# Patient Record
Sex: Female | Born: 1995 | Race: White | Hispanic: No | Marital: Single | Smoking: Former smoker
Health system: Southern US, Community
[De-identification: ages and names within clinical notes are randomized; demographics above are authoritative.]

## PROBLEM LIST (undated history)

## (undated) ENCOUNTER — Inpatient Hospital Stay (HOSPITAL_COMMUNITY): Payer: Self-pay

## (undated) DIAGNOSIS — F32A Depression, unspecified: Secondary | ICD-10-CM

## (undated) DIAGNOSIS — J45909 Unspecified asthma, uncomplicated: Secondary | ICD-10-CM

## (undated) DIAGNOSIS — G709 Myoneural disorder, unspecified: Secondary | ICD-10-CM

## (undated) DIAGNOSIS — K219 Gastro-esophageal reflux disease without esophagitis: Secondary | ICD-10-CM

## (undated) DIAGNOSIS — I499 Cardiac arrhythmia, unspecified: Secondary | ICD-10-CM

## (undated) DIAGNOSIS — N390 Urinary tract infection, site not specified: Secondary | ICD-10-CM

## (undated) DIAGNOSIS — Z789 Other specified health status: Secondary | ICD-10-CM

## (undated) DIAGNOSIS — Z5189 Encounter for other specified aftercare: Secondary | ICD-10-CM

## (undated) DIAGNOSIS — O24419 Gestational diabetes mellitus in pregnancy, unspecified control: Secondary | ICD-10-CM

## (undated) DIAGNOSIS — D649 Anemia, unspecified: Secondary | ICD-10-CM

## (undated) DIAGNOSIS — F329 Major depressive disorder, single episode, unspecified: Secondary | ICD-10-CM

## (undated) DIAGNOSIS — R519 Headache, unspecified: Secondary | ICD-10-CM

## (undated) DIAGNOSIS — Z8489 Family history of other specified conditions: Secondary | ICD-10-CM

## (undated) HISTORY — DX: Major depressive disorder, single episode, unspecified: F32.9

## (undated) HISTORY — DX: Urinary tract infection, site not specified: N39.0

## (undated) HISTORY — DX: Depression, unspecified: F32.A

## (undated) HISTORY — PX: NO PAST SURGERIES: SHX2092

---
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
## 2018-02-15 ENCOUNTER — Inpatient Hospital Stay (HOSPITAL_COMMUNITY): Payer: Medicaid Other

## 2018-02-15 ENCOUNTER — Encounter (HOSPITAL_COMMUNITY): Payer: Self-pay | Admitting: *Deleted

## 2018-02-15 ENCOUNTER — Inpatient Hospital Stay (HOSPITAL_COMMUNITY)
Admission: AD | Admit: 2018-02-15 | Discharge: 2018-02-15 | Disposition: A | Discharge status: Home / Self Care | Payer: Medicaid Other | Source: Ambulatory Visit | Attending: Obstetrics & Gynecology | Admitting: Obstetrics & Gynecology

## 2018-02-15 DIAGNOSIS — R109 Unspecified abdominal pain: Secondary | ICD-10-CM

## 2018-02-15 DIAGNOSIS — Z3A01 Less than 8 weeks gestation of pregnancy: Secondary | ICD-10-CM | POA: Insufficient documentation

## 2018-02-15 DIAGNOSIS — O26891 Other specified pregnancy related conditions, first trimester: Secondary | ICD-10-CM

## 2018-02-15 DIAGNOSIS — Z3491 Encounter for supervision of normal pregnancy, unspecified, first trimester: Secondary | ICD-10-CM

## 2018-02-15 LAB — CBC
HCT: 39.8 % (ref 36.0–46.0)
HEMOGLOBIN: 13.6 g/dL (ref 12.0–15.0)
MCH: 29.6 pg (ref 26.0–34.0)
MCHC: 34.2 g/dL (ref 30.0–36.0)
MCV: 86.7 fL (ref 78.0–100.0)
Platelets: 279 10*3/uL (ref 150–400)
RBC: 4.59 MIL/uL (ref 3.87–5.11)
RDW: 13.4 % (ref 11.5–15.5)
WBC: 9.5 10*3/uL (ref 4.0–10.5)

## 2018-02-15 LAB — POCT PREGNANCY, URINE: PREG TEST UR: POSITIVE — AB

## 2018-02-15 LAB — URINALYSIS, ROUTINE W REFLEX MICROSCOPIC
Bilirubin Urine: NEGATIVE
Glucose, UA: NEGATIVE mg/dL
Hgb urine dipstick: NEGATIVE
Ketones, ur: NEGATIVE mg/dL
Leukocytes, UA: NEGATIVE
NITRITE: NEGATIVE
Protein, ur: NEGATIVE mg/dL
Specific Gravity, Urine: 1.019 (ref 1.005–1.030)
pH: 6 (ref 5.0–8.0)

## 2018-02-15 LAB — ABO/RH: ABO/RH(D): A POS

## 2018-02-15 LAB — HCG, QUANTITATIVE, PREGNANCY: HCG, BETA CHAIN, QUANT, S: 8900 m[IU]/mL — AB (ref <5)

## 2018-02-15 LAB — WET PREP, GENITAL
CLUE CELLS WET PREP: NONE SEEN
Sperm: NONE SEEN
Trich, Wet Prep: NONE SEEN
YEAST WET PREP: NONE SEEN

## 2018-02-15 NOTE — Discharge Instructions (Signed)
[facility] Prenatal Care Providers   Center for [facility] at [facility]       Phone: [phone]  Center for [facility] [place] Phone: [phone]  Center for [employer] at [place]  Phone: [phone]  Center for [facility] at [facility]  Phone: [phone]  Center for [facility] at [facility]  Phone: [phone]  [facility]       Phone: [phone]  [facility]    Phone: [phone]   [facility])    Phone: [phone]  [facility]    Phone: [phone]  [facility]    Phone: [phone]    [facility]  Phone: [phone]  [facility]    Phone: [phone]  [facility]   Phone: [phone]  [facility]    Phone: [phone]      First Trimester of Pregnancy The first trimester of pregnancy is from week 1 until the end of week 13 (months 1 through 3). A week after a sperm fertilizes an egg, the egg will implant on the wall of the uterus. This embryo will begin to develop into a baby. Genes from you and your partner will form the baby. The female genes will determine whether the baby will be a boy or a girl. At 6-8 weeks, the eyes and face will be formed, and the heartbeat can be seen on ultrasound. At the end of 12 weeks, all the baby's organs will be formed. Now that you are pregnant, you will want to do everything you can to have a healthy baby. Two of the most important things are to get good prenatal care and to follow your health care provider's instructions. Prenatal care is all the medical care you receive before the baby's birth. This care will help prevent, find, and treat any problems during the pregnancy and childbirth. Body changes during your first trimester Your body goes through many changes  during pregnancy. The changes vary from woman to woman.  You may gain or lose a couple of pounds at first.  You may feel sick to your stomach (nauseous) and you may throw up (vomit). If the vomiting is uncontrollable, call your health care provider.  You may tire easily.  You may develop headaches that can be relieved by medicines. All medicines should be approved by your health care provider.  You may urinate more often. Painful urination may mean you have a bladder infection.  You may develop heartburn as a result of your pregnancy.  You may develop constipation because certain hormones are causing the muscles that push stool through your intestines to slow down.  You may develop hemorrhoids or swollen veins (varicose veins).  Your breasts may begin to grow larger and become tender. Your nipples may stick out more, and the tissue that surrounds them (areola) may become darker.  Your gums may bleed and may be sensitive to brushing and flossing.  Dark spots or blotches (chloasma, mask of pregnancy) may develop on your face. This will likely fade after the baby is born.  Your menstrual periods will stop.  You may have a loss of appetite.  You may develop cravings for certain kinds of food.  You may have changes in your emotions from day to day, such as being excited to be pregnant or being concerned that something may go wrong with the pregnancy and baby.  You may have more vivid  and strange dreams.  You may have changes in your hair. These can include thickening of your hair, rapid growth, and changes in texture. Some women also have hair loss during or after pregnancy, or hair that feels dry or thin. Your hair will most likely return to normal after your baby is born.  What to expect at prenatal visits During a routine prenatal visit:  You will be weighed to make sure you and the baby are growing normally.  Your blood pressure will be taken.  Your abdomen will be measured  to track your baby's growth.  The fetal heartbeat will be listened to between weeks 10 and 14 of your pregnancy.  Test results from any previous visits will be discussed.  Your health care provider may ask you:  How you are feeling.  If you are feeling the baby move.  If you have had any abnormal symptoms, such as leaking fluid, bleeding, severe headaches, or abdominal cramping.  If you are using any tobacco products, including cigarettes, chewing tobacco, and electronic cigarettes.  If you have any questions.  Other tests that may be performed during your first trimester include:  Blood tests to find your blood type and to check for the presence of any previous infections. The tests will also be used to check for low iron levels (anemia) and protein on red blood cells (Rh antibodies). Depending on your risk factors, or if you previously had diabetes during pregnancy, you may have tests to check for high blood sugar that affects pregnant women (gestational diabetes).  Urine tests to check for infections, diabetes, or protein in the urine.  An ultrasound to confirm the proper growth and development of the baby.  Fetal screens for spinal cord problems (spina bifida) and Down syndrome.  HIV (human immunodeficiency virus) testing. Routine prenatal testing includes screening for HIV, unless you choose not to have this test.  You may need other tests to make sure you and the baby are doing well.  Follow these instructions at home: Medicines  Follow your health care provider's instructions regarding medicine use. Specific medicines may be either safe or unsafe to take during pregnancy.  Take a prenatal vitamin that contains at least 600 micrograms (mcg) of folic acid.  If you develop constipation, try taking a stool softener if your health care provider approves. Eating and drinking  Eat a balanced diet that includes fresh fruits and vegetables, whole grains, good sources of  protein such as meat, eggs, or tofu, and low-fat dairy. Your health care provider will help you determine the amount of weight gain that is right for you.  Avoid raw meat and uncooked cheese. These carry germs that can cause birth defects in the baby.  Eating four or five small meals rather than three large meals a day may help relieve nausea and vomiting. If you start to feel nauseous, eating a few soda crackers can be helpful. Drinking liquids between meals, instead of during meals, also seems to help ease nausea and vomiting.  Limit foods that are high in fat and processed sugars, such as fried and sweet foods.  To prevent constipation: ? Eat foods that are high in fiber, such as fresh fruits and vegetables, whole grains, and beans. ? Drink enough fluid to keep your urine clear or pale yellow. Activity  Exercise only as directed by your health care provider. Most women can continue their usual exercise routine during pregnancy. Try to exercise for 30 minutes at least 5 days a  week. Exercising will help you: ? Control your weight. ? Stay in shape. ? Be prepared for labor and delivery.  Experiencing pain or cramping in the lower abdomen or lower back is a good sign that you should stop exercising. Check with your health care provider before continuing with normal exercises.  Try to avoid standing for long periods of time. Move your legs often if you must stand in one place for a long time.  Avoid heavy lifting.  Wear low-heeled shoes and practice good posture.  You may continue to have sex unless your health care provider tells you not to. Relieving pain and discomfort  Wear a good support bra to relieve breast tenderness.  Take warm sitz baths to soothe any pain or discomfort caused by hemorrhoids. Use hemorrhoid cream if your health care provider approves.  Rest with your legs elevated if you have leg cramps or low back pain.  If you develop varicose veins in your legs, wear  support hose. Elevate your feet for 15 minutes, 3-4 times a day. Limit salt in your diet. Prenatal care  Schedule your prenatal visits by the twelfth week of pregnancy. They are usually scheduled monthly at first, then more often in the last 2 months before delivery.  Write down your questions. Take them to your prenatal visits.  Keep all your prenatal visits as told by your health care provider. This is important. Safety  Wear your seat belt at all times when driving.  Make a list of emergency phone numbers, including numbers for family, friends, the hospital, and police and fire departments. General instructions  Ask your health care provider for a referral to a local prenatal education class. Begin classes no later than the beginning of month 6 of your pregnancy.  Ask for help if you have counseling or nutritional needs during pregnancy. Your health care provider can offer advice or refer you to specialists for help with various needs.  Do not use hot tubs, steam rooms, or saunas.  Do not douche or use tampons or scented sanitary pads.  Do not cross your legs for long periods of time.  Avoid cat litter boxes and soil used by cats. These carry germs that can cause birth defects in the baby and possibly loss of the fetus by miscarriage or stillbirth.  Avoid all smoking, herbs, alcohol, and medicines not prescribed by your health care provider. Chemicals in these products affect the formation and growth of the baby.  Do not use any products that contain nicotine or tobacco, such as cigarettes and e-cigarettes. If you need help quitting, ask your health care provider. You may receive counseling support and other resources to help you quit.  Schedule a dentist appointment. At home, brush your teeth with a soft toothbrush and be gentle when you floss. Contact a health care provider if:  You have dizziness.  You have mild pelvic cramps, pelvic pressure, or nagging pain in the  abdominal area.  You have persistent nausea, vomiting, or diarrhea.  You have a bad smelling vaginal discharge.  You have pain when you urinate.  You notice increased swelling in your face, hands, legs, or ankles.  You are exposed to fifth disease or chickenpox.  You are exposed to [place] measles (rubella) and have never had it. Get help right away if:  You have a fever.  You are leaking fluid from your vagina.  You have spotting or bleeding from your vagina.  You have severe abdominal cramping or pain.  You have rapid weight gain or loss.  You vomit blood or material that looks like coffee grounds.  You develop a severe headache.  You have shortness of breath.  You have any kind of trauma, such as from a fall or a car accident. Summary  The first trimester of pregnancy is from week 1 until the end of week 13 (months 1 through 3).  Your body goes through many changes during pregnancy. The changes vary from woman to woman.  You will have routine prenatal visits. During those visits, your health care provider will examine you, discuss any test results you may have, and talk with you about how you are feeling. This information is not intended to replace advice given to you by your health care provider. Make sure you discuss any questions you have with your health care provider. Document Released: 08/22/2001 Document Revised: 08/09/2016 Document Reviewed: 08/09/2016 Elsevier Interactive Patient Education  [employer].

## 2018-02-15 NOTE — MAU Provider Note (Signed)
History     CSN: [id number]  Arrival date and time: 02/15/18 1309   First Provider Initiated Contact with Patient 02/15/18 1412      Chief Complaint  Patient presents with  . Abdominal Pain  . Possible Pregnancy   HPI  Gloria Berg is a 22 y.o. G1P0 at 7132w6d by LMP who presents with abdominal cramping. Reports lower abdominal cramping x 1 week. Initially felt like her period was going to start. Occasionally pain feels like a "catch" in her side. Pain is worse on right side. Rates pain 5/10. Has not treated. Nothing makes better or worse. Had a positive pregnancy test yesterday.  Occasional nausea & vomits after eating. Also reports intermittent episodes of watery stool for the last 2 weeks. Her mother has similar symptoms. Denies fever/chills, hematochezia,, vaginal bleeding, dysuria. Some vaginal discharge that is thin and white. No vaginal odor or irritation.    OB History    Gravida  1   Para      Term      Preterm      AB      Living        SAB      TAB      Ectopic      Multiple      Live Births              Past Medical History:  Diagnosis Date  . Urinary tract infection     No past surgical history on file.  Family History  Problem Relation Age of Onset  . Drug abuse Mother   . Anxiety disorder Father        takes medication for  . Drug abuse Maternal Aunt   . Drug abuse Maternal Uncle   . Drug abuse Maternal Grandfather        died of overdose    Social History   Tobacco Use  . Smoking status: Never Smoker  . Smokeless tobacco: Never Used  Substance Use Topics  . Alcohol use: No    Comment: 1x June 2013  . Drug use: No    Types: Marijuana    Comment: 6 times between May and June 2013    Allergies: No Known Allergies  Medications Prior to Admission  Medication Sig Dispense Refill Last Dose  . acetaminophen (TYLENOL) 500 MG tablet Take 1,000 mg by mouth every 6 (six) hours as needed for pain.   05/04/2013 at Unknown  .  ibuprofen (ADVIL,MOTRIN) 600 MG tablet Take 1 tablet (600 mg total) by mouth every 6 (six) hours as needed for pain. 30 tablet 0     Review of Systems  Constitutional: Negative.   Gastrointestinal: Positive for abdominal pain, diarrhea, nausea and vomiting. Negative for blood in stool and constipation.  Genitourinary: Positive for vaginal discharge. Negative for dysuria and vaginal bleeding.   Physical Exam   Blood pressure 122/70, pulse 96, temperature 98.2 F (36.8 C), temperature source Oral, resp. rate 16, height 5\' 3"  (1.6 m), weight 152 lb (68.9 kg), last menstrual period 01/05/2018, SpO2 100 %.  Physical Exam  Nursing note and vitals reviewed. Constitutional: She is oriented to person, place, and time. She appears well-developed and well-nourished. No distress.  HENT:  Head: Normocephalic and atraumatic.  Eyes: Conjunctivae are normal. Right eye exhibits no discharge. Left eye exhibits no discharge. No scleral icterus.  Neck: Normal range of motion.  Respiratory: Effort normal. No respiratory distress.  GI: Soft. There is no tenderness.  Genitourinary:  No bleeding in the vagina.  Genitourinary Comments: Cervix closed  Neurological: She is alert and oriented to person, place, and time.  Skin: Skin is warm and dry. She is not diaphoretic.  Psychiatric: She has a normal mood and affect. Her behavior is normal. Judgment and thought content normal.    MAU Course  Procedures Results for orders placed or performed during the hospital encounter of 02/15/18 (from the past 24 hour(s))  Urinalysis, Routine w reflex microscopic     Status: None   Collection Time: 02/15/18  1:42 PM  Result Value Ref Range   Color, Urine YELLOW YELLOW   APPearance CLEAR CLEAR   Specific Gravity, Urine 1.019 1.005 - 1.030   pH 6.0 5.0 - 8.0   Glucose, UA NEGATIVE NEGATIVE mg/dL   Hgb urine dipstick NEGATIVE NEGATIVE   Bilirubin Urine NEGATIVE NEGATIVE   Ketones, ur NEGATIVE NEGATIVE mg/dL    Protein, ur NEGATIVE NEGATIVE mg/dL   Nitrite NEGATIVE NEGATIVE   Leukocytes, UA NEGATIVE NEGATIVE  Pregnancy, urine POC     Status: Abnormal   Collection Time: 02/15/18  2:07 PM  Result Value Ref Range   Preg Test, Ur POSITIVE (A) NEGATIVE  CBC     Status: None   Collection Time: 02/15/18  2:27 PM  Result Value Ref Range   WBC 9.5 4.0 - 10.5 K/uL   RBC 4.59 3.87 - 5.11 MIL/uL   Hemoglobin 13.6 12.0 - 15.0 g/dL   HCT 16.1 09.6 - 04.5 %   MCV 86.7 78.0 - 100.0 fL   MCH 29.6 26.0 - 34.0 pg   MCHC 34.2 30.0 - 36.0 g/dL   RDW 40.9 81.1 - 91.4 %   Platelets 279 150 - 400 K/uL  ABO/Rh     Status: None (Preliminary result)   Collection Time: 02/15/18  2:27 PM  Result Value Ref Range   ABO/RH(D)      A POS Performed at [facility], [address], [place], [place] [address]   hCG, quantitative, pregnancy     Status: Abnormal   Collection Time: 02/15/18  2:27 PM  Result Value Ref Range   hCG, Beta Chain, Quant, S 8,900 (H) <5 mIU/mL  Wet prep, genital     Status: Abnormal   Collection Time: 02/15/18  3:45 PM  Result Value Ref Range   Yeast Wet Prep HPF POC NONE SEEN NONE SEEN   Trich, Wet Prep NONE SEEN NONE SEEN   Clue Cells Wet Prep HPF POC NONE SEEN NONE SEEN   WBC, Wet Prep HPF POC MODERATE (A) NONE SEEN   Sperm NONE SEEN    US Ob Less Than 14 Weeks With Ob Transvaginal  Result Date: 02/15/2018 CLINICAL DATA:  Cramping for 1 week. Gestational age by last menstrual period 5 weeks and 6 days. EXAM: OBSTETRIC <14 WK [place] AND TRANSVAGINAL OB US TECHNIQUE: Both transabdominal and transvaginal ultrasound examinations were performed for complete evaluation of the gestation as well as the maternal uterus, adnexal regions, and pelvic cul-de-sac. Transvaginal technique was performed to assess early pregnancy. COMPARISON:  None. FINDINGS: Intrauterine gestational sac: Present. Yolk sac:  Present. Embryo:  Not present. Cardiac Activity: Not present. MSD: 7.2 mm   5 w   3 d Subchorionic  hemorrhage:  Small subchorionic hemorrhage. Maternal uterus/adnexae: Normal appearance of the adnexa. No free fluid. IMPRESSION: Probable early intrauterine gestational and yolk sac, without fetal pole, or cardiac activity yet visualized. Small subchorionic hemorrhage. Recommend follow-up quantitative B-HCG levels and follow-up US in  14 days to confirm and assess viability. This recommendation follows SRU consensus guidelines: Diagnostic Criteria for Nonviable Pregnancy Early in the First Trimester. Malva Limes Med 2013; 811:9147-82. Electronically Signed   By: Awilda Metro M.D.   On: 02/15/2018 15:17    MDM +UPT UA, wet prep, GC/chlamydia, CBC, ABO/Rh, quant hCG, and [place] today to rule out ectopic pregnancy U/a negative Wet prep negative Ultrasound shows IUGS with yolk sac. Assessment and Plan  A: 1. Normal IUP (intrauterine pregnancy) on prenatal ultrasound, first trimester   2. Abdominal pain during pregnancy in first trimester    P: Discharge home Start prenatal care GC/CT pending Discussed reasons to return to MAU  Judeth Horn 02/15/2018, 2:12 PM

## 2018-02-15 NOTE — MAU Note (Signed)
Pt reports LMP on 01/05/2018, positive home preg test yesterday but she is having a lot of cramping.

## 2018-02-18 LAB — GC/CHLAMYDIA PROBE AMP (~~LOC~~) NOT AT ARMC
CHLAMYDIA, DNA PROBE: NEGATIVE
NEISSERIA GONORRHEA: NEGATIVE

## 2018-03-22 LAB — OB RESULTS CONSOLE HEPATITIS B SURFACE ANTIGEN: Hepatitis B Surface Ag: NEGATIVE

## 2018-03-22 LAB — OB RESULTS CONSOLE ABO/RH: RH TYPE: POSITIVE

## 2018-03-22 LAB — OB RESULTS CONSOLE GC/CHLAMYDIA
Chlamydia: NEGATIVE
Gonorrhea: NEGATIVE

## 2018-03-22 LAB — OB RESULTS CONSOLE RPR: RPR: NONREACTIVE

## 2018-03-22 LAB — OB RESULTS CONSOLE ANTIBODY SCREEN: Antibody Screen: NEGATIVE

## 2018-03-22 LAB — OB RESULTS CONSOLE HIV ANTIBODY (ROUTINE TESTING): HIV: NONREACTIVE

## 2018-03-22 LAB — OB RESULTS CONSOLE RUBELLA ANTIBODY, IGM: RUBELLA: IMMUNE

## 2018-03-24 ENCOUNTER — Inpatient Hospital Stay (HOSPITAL_COMMUNITY)
Admission: AD | Admit: 2018-03-24 | Discharge: 2018-03-24 | Disposition: A | Discharge status: Home / Self Care | Payer: Medicaid Other | Source: Ambulatory Visit | Attending: Obstetrics and Gynecology | Admitting: Obstetrics and Gynecology

## 2018-03-24 ENCOUNTER — Other Ambulatory Visit: Payer: Self-pay

## 2018-03-24 ENCOUNTER — Encounter (HOSPITAL_COMMUNITY): Payer: Self-pay

## 2018-03-24 DIAGNOSIS — O99341 Other mental disorders complicating pregnancy, first trimester: Secondary | ICD-10-CM | POA: Insufficient documentation

## 2018-03-24 DIAGNOSIS — M79604 Pain in right leg: Secondary | ICD-10-CM | POA: Insufficient documentation

## 2018-03-24 DIAGNOSIS — Z3A11 11 weeks gestation of pregnancy: Secondary | ICD-10-CM | POA: Diagnosis not present

## 2018-03-24 DIAGNOSIS — F329 Major depressive disorder, single episode, unspecified: Secondary | ICD-10-CM | POA: Diagnosis not present

## 2018-03-24 DIAGNOSIS — Z87891 Personal history of nicotine dependence: Secondary | ICD-10-CM | POA: Insufficient documentation

## 2018-03-24 DIAGNOSIS — K920 Hematemesis: Secondary | ICD-10-CM

## 2018-03-24 DIAGNOSIS — O99511 Diseases of the respiratory system complicating pregnancy, first trimester: Secondary | ICD-10-CM | POA: Insufficient documentation

## 2018-03-24 DIAGNOSIS — Z79899 Other long term (current) drug therapy: Secondary | ICD-10-CM | POA: Insufficient documentation

## 2018-03-24 DIAGNOSIS — J45909 Unspecified asthma, uncomplicated: Secondary | ICD-10-CM | POA: Diagnosis not present

## 2018-03-24 HISTORY — DX: Other specified health status: Z78.9

## 2018-03-24 HISTORY — DX: Unspecified asthma, uncomplicated: J45.909

## 2018-03-24 LAB — URINALYSIS, ROUTINE W REFLEX MICROSCOPIC
Bilirubin Urine: NEGATIVE
Glucose, UA: NEGATIVE mg/dL
Hgb urine dipstick: NEGATIVE
Ketones, ur: 80 mg/dL — AB
LEUKOCYTES UA: NEGATIVE
NITRITE: NEGATIVE
Protein, ur: NEGATIVE mg/dL
SPECIFIC GRAVITY, URINE: 1.011 (ref 1.005–1.030)
pH: 6 (ref 5.0–8.0)

## 2018-03-24 MED ORDER — PANTOPRAZOLE SODIUM 40 MG PO TBEC
40.0000 mg | DELAYED_RELEASE_TABLET | Freq: Every day | ORAL | Status: DC
  Administered 2018-03-24: 40 mg via ORAL
  Filled 2018-03-24 (×2): qty 1

## 2018-03-24 MED ORDER — ONDANSETRON 8 MG PO TBDP: 8.0000 mg | ORAL_TABLET | Freq: Three times a day (TID) | ORAL | 0 refills | Status: DC | PRN

## 2018-03-24 MED ORDER — PANTOPRAZOLE SODIUM 40 MG PO TBEC: 40.0000 mg | DELAYED_RELEASE_TABLET | Freq: Every day | ORAL | 0 refills | Status: DC

## 2018-03-24 MED ORDER — ONDANSETRON 8 MG PO TBDP
8.0000 mg | ORAL_TABLET | Freq: Once | ORAL | Status: AC
  Administered 2018-03-24: 8 mg via ORAL
  Filled 2018-03-24: qty 1

## 2018-03-24 NOTE — MAU Note (Signed)
Was dry heaving this AM and then threw up blood 30 min ago, no emesis since, still nauseated.

## 2018-03-24 NOTE — MAU Provider Note (Signed)
Chief Complaint: Emesis and Nausea   SUBJECTIVE HPI: Gloria Berg is a 22 y.o. G1P0 at 6467w1d who presents to MAU with complaint of nausea in the first trimester, but unable to pick up bonjesta due to the price, pt also had one bout of vomiting at 1pm today and post vomiting pt spit up in to the toilet bowel a gold ball size blood and mucous. Pt denies this ever happening before, denies abdominal pain, currently pt iis in NAD drinking water and tolerating well. Pt denies nausea or vomiting now. Pt denies dysuria, rashes, fever, and endorses feeling constipated, but have her new normal constipated bowel movement. Pt endorses having her NOB appointment scheduled tomorrow with [facility].   Pt is diagnosed with major depression disorder but not on medications. Pt also endorses having chronic right leg pain that radiates towards her right abdominal side, worse with movement from falling down stairs prior to pregnancy. 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   Past Medical History:  Diagnosis Date  . Asthma   . Medical history non-contributory   . Urinary tract infection    OB History  Gravida Para Term Preterm AB Living  1            SAB TAB Ectopic Multiple Live Births               # Outcome Date GA Lbr Len/2nd Weight Sex Delivery Anes PTL Lv  1 Current            Past Surgical History:  Procedure Laterality Date  . NO PAST SURGERIES     Social History   Socioeconomic History  . Marital status: Single    Spouse name: Not on file  . Number of children: Not on file  . Years of education: Not on file  . Highest education level: Not on file  Occupational History  . Not on file  Social Needs  . Financial resource strain: Not on file  . Food insecurity:    Worry: Not on file    Inability: Not on file  . Transportation needs:    Medical: Not on file    Non-medical: Not on file  Tobacco Use  . Smoking status: Former Smoker    Types: Cigarettes    Last attempt to quit: 02/14/2018     Years since quitting: 0.1  . Smokeless tobacco: Never Used  Substance and Sexual Activity  . Alcohol use: No    Comment: 1x June 2013  . Drug use: No    Types: Marijuana    Comment: 6 times between May and June 2013  . Sexual activity: Yes    Birth control/protection: None  Lifestyle  . Physical activity:    Days per week: Not on file    Minutes per session: Not on file  . Stress: Not on file  Relationships  . Social connections:    Talks on phone: Not on file    Gets together: Not on file    Attends religious service: Not on file    Active member of club or organization: Not on file    Attends meetings of clubs or organizations: Not on file    Relationship status: Not on file  . Intimate partner violence:    Fear of current or ex partner: Not on file    Emotionally abused: Not on file    Physically abused: Not on file    Forced sexual activity: Not on file  Other Topics Concern  .  Not on file  Social History Narrative  . Not on file   No current facility-administered medications on file prior to encounter.    Current Outpatient Medications on File Prior to Encounter  Medication Sig Dispense Refill  . acetaminophen (TYLENOL) 500 MG tablet Take 1,000 mg by mouth every 6 (six) hours as needed for pain.     No Known Allergies  I have reviewed the past Medical Hx, Surgical Hx, Social Hx, Allergies and Medications.   REVIEW OF SYSTEMS All systems reviewed and are negative for acute change except as noted in the HPI.   OBJECTIVE BP 100/72 (BP Location: Right Arm)   Pulse 90   Temp (!) 97.5 F (36.4 C) (Oral)   Resp 18   Ht 5\' 3"  (1.6 m)   Wt 68 kg (150 lb)   LMP 01/05/2018   BMI 26.57 kg/m    PHYSICAL EXAM Constitutional: Well-developed, well-nourished female in no acute distress.  Cardiovascular: normal rate and rhythm, pulses intact Respiratory: normal rate and effort.  GI: Abd soft, non-tender, non-distended. Pos BS x 4 MS: Extremities nontender, no  edema, normal ROM Neurologic: Alert and oriented x 4. No focal deficits GU: Neg CVAT. SPECULUM EXAM: Deferred  BIMANUAL: Deferred Psych: normal mood and affect  LAB RESULTS Results for orders placed or performed during the hospital encounter of 03/24/18 (from the past 24 hour(s))  Urinalysis, Routine w reflex microscopic     Status: Abnormal   Collection Time: 03/24/18  4:25 PM  Result Value Ref Range   Color, Urine YELLOW YELLOW   APPearance CLEAR CLEAR   Specific Gravity, Urine 1.011 1.005 - 1.030   pH 6.0 5.0 - 8.0   Glucose, UA NEGATIVE NEGATIVE mg/dL   Hgb urine dipstick NEGATIVE NEGATIVE   Bilirubin Urine NEGATIVE NEGATIVE   Ketones, ur 80 (A) NEGATIVE mg/dL   Protein, ur NEGATIVE NEGATIVE mg/dL   Nitrite NEGATIVE NEGATIVE   Leukocytes, UA NEGATIVE NEGATIVE    IMAGING No results found.  MAU Management/MDM: Vitals and nursing notes reviewed Orders Placed This Encounter  Procedures  . Urinalysis, Routine w reflex microscopic  . Diet - low sodium heart healthy  . Increase activity slowly  . Discharge patient Discharge disposition: 01-Home or Self Care; Discharge patient date: 03/24/2018    Meds ordered this encounter  Medications  . ondansetron (ZOFRAN-ODT) disintegrating tablet 8 mg  . pantoprazole (PROTONIX) EC tablet 40 mg  . ondansetron (ZOFRAN-ODT) 8 MG disintegrating tablet    Sig: Take 1 tablet (8 mg total) by mouth every 8 (eight) hours as needed for nausea or vomiting.    Dispense:  20 tablet    Refill:  0    Order Specific Question:   Supervising Provider    Answer:   ROBERTS, ANGELA [[id number]]  . pantoprazole (PROTONIX) 40 MG tablet    Sig: Take 1 tablet (40 mg total) by mouth daily for 14 days.    Dispense:  14 tablet    Refill:  0    Order Specific Question:   Supervising Provider    Answer:   Osborn Coho [[id number]]    Plan of care reviewed with patient, including labs and tests ordered and medical treatment.  Consult Dr Dion Body.  Treatments in MAU  included neg UA, protonix and Zofran resolved nausea, po challenge tolerated, discharged home in stable condition. .   ASSESSMENT Gloria Berg is a 22 y.o. G1P0 at [redacted]w[redacted]d dx with x one bout of hematemesis, nausea, x 1 vomit, which  has resolved, pt took Zofran and Protonix x 1 dose each, and tolerated PO challenge. UA negative. Pt stable and discharge home.  1. Hematemesis with nausea   2. [redacted] weeks gestation of pregnancy     PLAN Discharge home in stable condition. Pt discharged with strict bleeding precautions. F/u tomorrow for NOB Nausea: eat every 2 hours protein, may eat ginger candies for nausea, take OTC Unisom and B6 together.  Hematemesis: continue to take Protonix x 2 weeks. If this happens again will consult with GI.  Counseled on return precautions Handout given  Dr Dion Body aware of plan of care and verbalized agreement with plan.   Follow-up Information    [facility] Follow up in 1 day(s).   Specialty:  Obstetrics and Gynecology Why:  Please go to your NOB visit tomorrow.  Contact information: [address] [place] [address] [phone]          Allergies as of 03/24/2018   No Known Allergies     Medication List    TAKE these medications   acetaminophen 500 MG tablet Commonly known as:  TYLENOL Take 1,000 mg by mouth every 6 (six) hours as needed for pain.   ondansetron 8 MG disintegrating tablet Commonly known as:  ZOFRAN-ODT Take 1 tablet (8 mg total) by mouth every 8 (eight) hours as needed for nausea or vomiting.   pantoprazole 40 MG tablet Commonly known as:  PROTONIX Take 1 tablet (40 mg total) by mouth daily for 14 days.      Gloria Berg 03/24/2018, 4:51 PM

## 2018-06-26 ENCOUNTER — Inpatient Hospital Stay (HOSPITAL_COMMUNITY)
Admission: AD | Admit: 2018-06-26 | Discharge: 2018-06-26 | Disposition: A | Discharge status: Home / Self Care | Payer: Medicaid Other | Source: Ambulatory Visit | Attending: Obstetrics & Gynecology | Admitting: Obstetrics & Gynecology

## 2018-06-26 ENCOUNTER — Encounter (HOSPITAL_COMMUNITY): Payer: Self-pay

## 2018-06-26 DIAGNOSIS — M5440 Lumbago with sciatica, unspecified side: Secondary | ICD-10-CM | POA: Insufficient documentation

## 2018-06-26 DIAGNOSIS — O9989 Other specified diseases and conditions complicating pregnancy, childbirth and the puerperium: Secondary | ICD-10-CM | POA: Diagnosis not present

## 2018-06-26 DIAGNOSIS — J45909 Unspecified asthma, uncomplicated: Secondary | ICD-10-CM | POA: Diagnosis not present

## 2018-06-26 DIAGNOSIS — M549 Dorsalgia, unspecified: Secondary | ICD-10-CM

## 2018-06-26 DIAGNOSIS — O26892 Other specified pregnancy related conditions, second trimester: Secondary | ICD-10-CM | POA: Insufficient documentation

## 2018-06-26 DIAGNOSIS — Z3A24 24 weeks gestation of pregnancy: Secondary | ICD-10-CM | POA: Insufficient documentation

## 2018-06-26 DIAGNOSIS — Z87891 Personal history of nicotine dependence: Secondary | ICD-10-CM | POA: Diagnosis not present

## 2018-06-26 DIAGNOSIS — O99512 Diseases of the respiratory system complicating pregnancy, second trimester: Secondary | ICD-10-CM | POA: Diagnosis not present

## 2018-06-26 DIAGNOSIS — M9903 Segmental and somatic dysfunction of lumbar region: Secondary | ICD-10-CM

## 2018-06-26 DIAGNOSIS — M5430 Sciatica, unspecified side: Secondary | ICD-10-CM

## 2018-06-26 LAB — URINALYSIS, ROUTINE W REFLEX MICROSCOPIC
Bilirubin Urine: NEGATIVE
Glucose, UA: NEGATIVE mg/dL
HGB URINE DIPSTICK: NEGATIVE
Ketones, ur: NEGATIVE mg/dL
LEUKOCYTES UA: NEGATIVE
NITRITE: NEGATIVE
PH: 5 (ref 5.0–8.0)
Protein, ur: 30 mg/dL — AB
SPECIFIC GRAVITY, URINE: 1.032 — AB (ref 1.005–1.030)

## 2018-06-26 NOTE — MAU Note (Signed)
Back pain ongoing in pregnancy but it is getting worse, meds are not helping  No bleeding, no LOF

## 2018-06-26 NOTE — Discharge Instructions (Signed)
Back Exercises The following exercises strengthen the muscles that help to support the back. They also help to keep the lower back flexible. Doing these exercises can help to prevent back pain or lessen existing pain. If you have back pain or discomfort, try doing these exercises 2-3 times each day or as told by your health care provider. When the pain goes away, do them once each day, but increase the number of times that you repeat the steps for each exercise (do more repetitions). If you do not have back pain or discomfort, do these exercises once each day or as told by your health care provider. Exercises Single Knee to Chest  Repeat these steps 3-5 times for each leg: 1. Lie on your back on a firm bed or the floor with your legs extended. 2. Bring one knee to your chest. Your other leg should stay extended and in contact with the floor. 3. Hold your knee in place by grabbing your knee or thigh. 4. Pull on your knee until you feel a gentle stretch in your lower back. 5. Hold the stretch for 10-30 seconds. 6. Slowly release and straighten your leg.  Pelvic Tilt  Repeat these steps 5-10 times: 1. Lie on your back on a firm bed or the floor with your legs extended. 2. Bend your knees so they are pointing toward the ceiling and your feet are flat on the floor. 3. Tighten your lower abdominal muscles to press your lower back against the floor. This motion will tilt your pelvis so your tailbone points up toward the ceiling instead of pointing to your feet or the floor. 4. With gentle tension and even breathing, hold this position for 5-10 seconds.  Cat-Cow  Repeat these steps until your lower back becomes more flexible: 1. Get into a hands-and-knees position on a firm surface. Keep your hands under your shoulders, and keep your knees under your hips. You may place padding under your knees for comfort. 2. Let your head hang down, and point your tailbone toward the floor so your lower back  becomes rounded like the back of a cat. 3. Hold this position for 5 seconds. 4. Slowly lift your head and point your tailbone up toward the ceiling so your back forms a sagging arch like the back of a cow. 5. Hold this position for 5 seconds.  Press-Ups  Repeat these steps 5-10 times: 1. Lie on your abdomen (face-down) on the floor. 2. Place your palms near your head, about shoulder-width apart. 3. While you keep your back as relaxed as possible and keep your hips on the floor, slowly straighten your arms to raise the top half of your body and lift your shoulders. Do not use your back muscles to raise your upper torso. You may adjust the placement of your hands to make yourself more comfortable. 4. Hold this position for 5 seconds while you keep your back relaxed. 5. Slowly return to lying flat on the floor.  Bridges  Repeat these steps 10 times: 1. Lie on your back on a firm surface. 2. Bend your knees so they are pointing toward the ceiling and your feet are flat on the floor. 3. Tighten your buttocks muscles and lift your buttocks off of the floor until your waist is at almost the same height as your knees. You should feel the muscles working in your buttocks and the back of your thighs. If you do not feel these muscles, slide your feet 1-2 inches farther away  from your buttocks. 4. Hold this position for 3-5 seconds. 5. Slowly lower your hips to the starting position, and allow your buttocks muscles to relax completely.  If this exercise is too easy, try doing it with your arms crossed over your chest. Abdominal Crunches  Repeat these steps 5-10 times: 1. Lie on your back on a firm bed or the floor with your legs extended. 2. Bend your knees so they are pointing toward the ceiling and your feet are flat on the floor. 3. Cross your arms over your chest. 4. Tip your chin slightly toward your chest without bending your neck. 5. Tighten your abdominal muscles and slowly raise your  trunk (torso) high enough to lift your shoulder blades a tiny bit off of the floor. Avoid raising your torso higher than that, because it can put too much stress on your low back and it does not help to strengthen your abdominal muscles. 6. Slowly return to your starting position.  Back Lifts Repeat these steps 5-10 times: 1. Lie on your abdomen (face-down) with your arms at your sides, and rest your forehead on the floor. 2. Tighten the muscles in your legs and your buttocks. 3. Slowly lift your chest off of the floor while you keep your hips pressed to the floor. Keep the back of your head in line with the curve in your back. Your eyes should be looking at the floor. 4. Hold this position for 3-5 seconds. 5. Slowly return to your starting position.  Contact a health care provider if:  Your back pain or discomfort gets much worse when you do an exercise.  Your back pain or discomfort does not lessen within 2 hours after you exercise. If you have any of these problems, stop doing these exercises right away. Do not do them again unless your health care provider says that you can. Get help right away if:  You develop sudden, severe back pain. If this happens, stop doing the exercises right away. Do not do them again unless your health care provider says that you can. This information is not intended to replace advice given to you by your health care provider. Make sure you discuss any questions you have with your health care provider. Document Released: 10/05/2004 Document Revised: 01/05/2016 Document Reviewed: 10/22/2014 Elsevier Interactive Patient Education  2017 [employer]   Back Exercises If you have pain in your back, do these exercises 2-3 times each day or as told by your doctor. When the pain goes away, do the exercises once each day, but repeat the steps more times for each exercise (do more repetitions). If you do not have pain in your back, do these exercises once each day or  as told by your doctor. Exercises Single Knee to Chest  Do these steps 3-5 times in a row for each leg: 1. Lie on your back on a firm bed or the floor with your legs stretched out. 2. Bring one knee to your chest. 3. Hold your knee to your chest by grabbing your knee or thigh. 4. Pull on your knee until you feel a gentle stretch in your lower back. 5. Keep doing the stretch for 10-30 seconds. 6. Slowly let go of your leg and straighten it.  Pelvic Tilt  Do these steps 5-10 times in a row: 1. Lie on your back on a firm bed or the floor with your legs stretched out. 2. Bend your knees so they point up to the ceiling. Your feet  should be flat on the floor. 3. Tighten your lower belly (abdomen) muscles to press your lower back against the floor. This will make your tailbone point up to the ceiling instead of pointing down to your feet or the floor. 4. Stay in this position for 5-10 seconds while you gently tighten your muscles and breathe evenly.  Cat-Cow  Do these steps until your lower back bends more easily: 1. Get on your hands and knees on a firm surface. Keep your hands under your shoulders, and keep your knees under your hips. You may put padding under your knees. 2. Let your head hang down, and make your tailbone point down to the floor so your lower back is round like the back of a cat. 3. Stay in this position for 5 seconds. 4. Slowly lift your head and make your tailbone point up to the ceiling so your back hangs low (sags) like the back of a cow. 5. Stay in this position for 5 seconds.  Press-Ups  Do these steps 5-10 times in a row: 1. Lie on your belly (face-down) on the floor. 2. Place your hands near your head, about shoulder-width apart. 3. While you keep your back relaxed and keep your hips on the floor, slowly straighten your arms to raise the top half of your body and lift your shoulders. Do not use your back muscles. To make yourself more comfortable, you may change  where you place your hands. 4. Stay in this position for 5 seconds. 5. Slowly return to lying flat on the floor.  Bridges  Do these steps 10 times in a row: 1. Lie on your back on a firm surface. 2. Bend your knees so they point up to the ceiling. Your feet should be flat on the floor. 3. Tighten your butt muscles and lift your butt off of the floor until your waist is almost as high as your knees. If you do not feel the muscles working in your butt and the back of your thighs, slide your feet 1-2 inches farther away from your butt. 4. Stay in this position for 3-5 seconds. 5. Slowly lower your butt to the floor, and let your butt muscles relax.  If this exercise is too easy, try doing it with your arms crossed over your chest. Belly Crunches  Do these steps 5-10 times in a row: 1. Lie on your back on a firm bed or the floor with your legs stretched out. 2. Bend your knees so they point up to the ceiling. Your feet should be flat on the floor. 3. Cross your arms over your chest. 4. Tip your chin a little bit toward your chest but do not bend your neck. 5. Tighten your belly muscles and slowly raise your chest just enough to lift your shoulder blades a tiny bit off of the floor. 6. Slowly lower your chest and your head to the floor.  Back Lifts Do these steps 5-10 times in a row: 1. Lie on your belly (face-down) with your arms at your sides, and rest your forehead on the floor. 2. Tighten the muscles in your legs and your butt. 3. Slowly lift your chest off of the floor while you keep your hips on the floor. Keep the back of your head in line with the curve in your back. Look at the floor while you do this. 4. Stay in this position for 3-5 seconds. 5. Slowly lower your chest and your face to the floor.  Contact a doctor if:  Your back pain gets a lot worse when you do an exercise.  Your back pain does not lessen 2 hours after you exercise. If you have any of these problems, stop  doing the exercises. Do not do them again unless your doctor says it is okay. Get help right away if:  You have sudden, very bad back pain. If this happens, stop doing the exercises. Do not do them again unless your doctor says it is okay. This information is not intended to replace advice given to you by your health care provider. Make sure you discuss any questions you have with your health care provider. Document Released: 09/30/2010 Document Revised: 02/03/2016 Document Reviewed: 10/22/2014 Elsevier Interactive Patient Education  [employer].

## 2018-06-26 NOTE — MAU Provider Note (Signed)
History     CSN: [id number]  Arrival date and time: 06/26/18 1446   First Provider Initiated Contact with Patient 06/26/18 1533      Chief Complaint  Patient presents with  . Back Pain   HPI   Ms.Gloria Berg is a 22 y.o. female G1P0 @ [redacted]w[redacted]d here in MAU with complaints of lower back pain. The pain has been present since the start of pregnancy. She feels it is getting worse. Says the pain is worse on the right side than on the left side. The pain starts in her lower back and shoots down both legs. She has seen physical therapy for this pain and nothing is helping. Says she was given flexeril however that makes her tired and she does not like to take medications.  Her next office visit is November 6th. Her last visit to PT was 2 weeks.  She had a fall back in February before she got pregnant and since then she has had swelling in her lower back and as soon as she got pregnant the sciatic nerve pain started. Denies weakness.   OB History    Gravida  1   Para      Term      Preterm      AB      Living        SAB      TAB      Ectopic      Multiple      Live Births              Past Medical History:  Diagnosis Date  . Asthma   . Medical history non-contributory   . Urinary tract infection     Past Surgical History:  Procedure Laterality Date  . NO PAST SURGERIES      Family History  Problem Relation Age of Onset  . Drug abuse Mother   . Anxiety disorder Father        takes medication for  . Drug abuse Maternal Grandfather        died of overdose  . Drug abuse Maternal Aunt   . Drug abuse Maternal Uncle     Social History   Tobacco Use  . Smoking status: Former Smoker    Types: Cigarettes    Last attempt to quit: 02/14/2018    Years since quitting: 0.3  . Smokeless tobacco: Never Used  Substance Use Topics  . Alcohol use: No    Comment: 1x June 2013  . Drug use: Yes    Types: Marijuana    Comment: last used 06/25/18    Allergies: No  Known Allergies  No medications prior to admission.   Results for orders placed or performed during the hospital encounter of 06/26/18 (from the past 48 hour(s))  Urinalysis, Routine w reflex microscopic     Status: Abnormal   Collection Time: 06/26/18  3:06 PM  Result Value Ref Range   Color, Urine AMBER (A) YELLOW    Comment: BIOCHEMICALS MAY BE AFFECTED BY COLOR   APPearance CLOUDY (A) CLEAR   Specific Gravity, Urine 1.032 (H) 1.005 - 1.030   pH 5.0 5.0 - 8.0   Glucose, UA NEGATIVE NEGATIVE mg/dL   Hgb urine dipstick NEGATIVE NEGATIVE   Bilirubin Urine NEGATIVE NEGATIVE   Ketones, ur NEGATIVE NEGATIVE mg/dL   Protein, ur 30 (A) NEGATIVE mg/dL   Nitrite NEGATIVE NEGATIVE   Leukocytes, UA NEGATIVE NEGATIVE   WBC, UA 0-5 0 - 5 WBC/hpf  Bacteria, UA RARE (A) NONE SEEN   Squamous Epithelial / LPF 6-10 0 - 5   Mucus PRESENT    Ca Oxalate Crys, UA PRESENT     Comment: Performed at [facility], [address], [place], [place] [address]    Review of Systems  Gastrointestinal: Negative for abdominal pain, constipation, diarrhea and vomiting.  Genitourinary: Negative for dysuria, frequency, hematuria, vaginal bleeding and vaginal discharge.  Musculoskeletal: Positive for back pain and joint swelling.   Physical Exam   Blood pressure 125/74, pulse (!) 104, temperature 98.1 F (36.7 C), temperature source Oral, resp. rate 18, weight 75 kg, last menstrual period 01/05/2018.  Physical Exam  Constitutional: She is oriented to person, place, and time. She appears well-developed and well-nourished. No distress.  HENT:  Head: Normocephalic.  Eyes: Pupils are equal, round, and reactive to light.  GI: Soft. She exhibits no distension. There is no tenderness. There is no rebound and no CVA tenderness.  Musculoskeletal: Normal range of motion.       Lumbar back: She exhibits tenderness and swelling. She exhibits normal range of motion and no spasm.       Back:  Golf ball size area  of swelling in right lumbar area of back.   Neurological: She is alert and oriented to person, place, and time.  Skin: Skin is warm. She is not diaphoretic.  Psychiatric: Her behavior is normal.   Fetal Tracing: Baseline: 135 bpm Variability: Moderate  Accelerations: 15x15 Decelerations: None Toco: None  MAU Course  Procedures  None  MDM  Urine culture pending  Dr. Adrian Blackwater in the room to evaluate patient, please see his note for documentation   Assessment and Plan   A:  1. Back pain affecting pregnancy in second trimester   2. Sciatic nerve pain, unspecified laterality     P:  Discharge home in stable condition F/u with Dr. Sallye Ober as scheduled Follow up with PT Return to MAU if symptoms worsen May need imaging if back pain persists.  Duane Lope, NP 06/26/2018 4:49 PM

## 2018-06-27 LAB — CULTURE, OB URINE
Culture: <10000 — AB
SPECIAL REQUESTS: NORMAL

## 2018-09-20 LAB — OB RESULTS CONSOLE GBS: STREP GROUP B AG: POSITIVE

## 2018-09-21 ENCOUNTER — Encounter (HOSPITAL_COMMUNITY): Payer: Self-pay

## 2018-09-21 ENCOUNTER — Inpatient Hospital Stay (HOSPITAL_COMMUNITY)
Admission: AD | Admit: 2018-09-21 | Discharge: 2018-09-21 | Disposition: A | Discharge status: Home / Self Care | Payer: Medicaid Other | Source: Ambulatory Visit | Attending: Obstetrics and Gynecology | Admitting: Obstetrics and Gynecology

## 2018-09-21 DIAGNOSIS — O471 False labor at or after 37 completed weeks of gestation: Secondary | ICD-10-CM

## 2018-09-21 DIAGNOSIS — Z3A37 37 weeks gestation of pregnancy: Secondary | ICD-10-CM | POA: Diagnosis not present

## 2018-09-21 DIAGNOSIS — O479 False labor, unspecified: Secondary | ICD-10-CM

## 2018-09-21 LAB — AMNISURE RUPTURE OF MEMBRANE (ROM) NOT AT ARMC: Amnisure ROM: NEGATIVE

## 2018-09-21 LAB — URINALYSIS, ROUTINE W REFLEX MICROSCOPIC
Bilirubin Urine: NEGATIVE
GLUCOSE, UA: NEGATIVE mg/dL
Hgb urine dipstick: NEGATIVE
Ketones, ur: NEGATIVE mg/dL
LEUKOCYTES UA: NEGATIVE
Nitrite: NEGATIVE
PH: 7 (ref 5.0–8.0)
Protein, ur: NEGATIVE mg/dL
Specific Gravity, Urine: 1.004 — ABNORMAL LOW (ref 1.005–1.030)

## 2018-09-21 LAB — POCT FERN TEST: POCT Fern Test: NEGATIVE

## 2018-09-21 NOTE — MAU Provider Note (Signed)
  S: Ms. Gloria PaneCheyenne N Kaeser is a 23 y.o. G1P0 at 6358w0d  who presents to MAU today complaining irregular contractions  And pelvic pressure that got worse since midnight.  She denies vaginal bleeding. She denies LOF.  She said her back of pants were wet earlier this evening but then she took a bath. Has had no further leaking since then. She reports normal fetal movement.    O: BP (!) 91/53 (BP Location: Right Arm)   Temp 97.8 F (36.6 C)   Resp 18   LMP 01/05/2018  GENERAL: Well-developed, well-nourished female in no acute distress.  HEAD: Normocephalic, atraumatic.  CHEST: Normal effort of breathing, regular heart rate ABDOMEN: Soft, nontender, gravid  Cervical exam:  Dilation: 2 Effacement (%): 80 Cervical Position: Posterior Station: -3 Presentation: Vertex Exam by:: TLYTLE RN   Fetal Monitoring: Baseline: 135 Variability: mod Accelerations: present Decelerations: absent Contractions: irregular   A: SIUP at 6858w0d  False labor  P: Discharge home with labor precautions  Marylene LandKooistra, Kathryn Lorraine, CNM 09/21/2018 6:32 AM

## 2018-09-21 NOTE — MAU Note (Signed)
I have communicated with Gloria Berg, CNM and reviewed vital signs:  Vitals:   09/21/18 0248 09/21/18 0434  BP:  (!) 91/53  Resp: 18   Temp: 97.8 F (36.6 C)     Vaginal exam:  Dilation: 2 Effacement (%): 80 Cervical Position: Posterior Station: -3 Presentation: Vertex Exam by:: TLYTLE RN,   Also reviewed contraction pattern and that non-stress test is reactive.  It has been documented that patient is contracting every irregularly with no cervical change over 1.5 hours not indicating active labor.  Patient denies any other complaints.  Based on this report provider has given order for discharge.  A discharge order and diagnosis entered by a provider.   Labor discharge instructions reviewed with patient.

## 2018-09-21 NOTE — Discharge Instructions (Signed)
Braxton Hicks Contractions Contractions of the uterus can occur throughout pregnancy, but they are not always a sign that you are in labor. You may have practice contractions called Braxton Hicks contractions. These false labor contractions are sometimes confused with true labor. What are Braxton Hicks contractions? Braxton Hicks contractions are tightening movements that occur in the muscles of the uterus before labor. Unlike true labor contractions, these contractions do not result in opening (dilation) and thinning of the cervix. Toward the end of pregnancy (32-34 weeks), Braxton Hicks contractions can happen more often and may become stronger. These contractions are sometimes difficult to tell apart from true labor because they can be very uncomfortable. You should not feel embarrassed if you go to the hospital with false labor. Sometimes, the only way to tell if you are in true labor is for your health care provider to look for changes in the cervix. The health care provider will do a physical exam and may monitor your contractions. If you are not in true labor, the exam should show that your cervix is not dilating and your water has not broken. If there are no other health problems associated with your pregnancy, it is completely safe for you to be sent home with false labor. You may continue to have Braxton Hicks contractions until you go into true labor. How to tell the difference between true labor and false labor True labor  Contractions last 30-70 seconds.  Contractions become very regular.  Discomfort is usually felt in the top of the uterus, and it spreads to the lower abdomen and low back.  Contractions do not go away with walking.  Contractions usually become more intense and increase in frequency.  The cervix dilates and gets thinner. False labor  Contractions are usually shorter and not as strong as true labor contractions.  Contractions are usually irregular.  Contractions  are often felt in the front of the lower abdomen and in the groin.  Contractions may go away when you walk around or change positions while lying down.  Contractions get weaker and are shorter-lasting as time goes on.  The cervix usually does not dilate or become thin. Follow these instructions at home:   Take over-the-counter and prescription medicines only as told by your health care provider.  Keep up with your usual exercises and follow other instructions from your health care provider.  Eat and drink lightly if you think you are going into labor.  If Braxton Hicks contractions are making you uncomfortable: ? Change your position from lying down or resting to walking, or change from walking to resting. ? Sit and rest in a tub of warm water. ? Drink enough fluid to keep your urine pale yellow. Dehydration may cause these contractions. ? Do slow and deep breathing several times an hour.  Keep all follow-up prenatal visits as told by your health care provider. This is important. Contact a health care provider if:  You have a fever.  You have continuous pain in your abdomen. Get help right away if:  Your contractions become stronger, more regular, and closer together.  You have fluid leaking or gushing from your vagina.  You pass blood-tinged mucus (bloody show).  You have bleeding from your vagina.  You have low back pain that you never had before.  You feel your baby's head pushing down and causing pelvic pressure.  Your baby is not moving inside you as much as it used to. Summary  Contractions that occur before labor are   called Braxton Hicks contractions, false labor, or practice contractions.  Braxton Hicks contractions are usually shorter, weaker, farther apart, and less regular than true labor contractions. True labor contractions usually become progressively stronger and regular, and they become more frequent.  Manage discomfort from Braxton Hicks contractions  by changing position, resting in a warm bath, drinking plenty of water, or practicing deep breathing. This information is not intended to replace advice given to you by your health care provider. Make sure you discuss any questions you have with your health care provider. Document Released: 01/11/2017 Document Revised: 06/12/2017 Document Reviewed: 01/11/2017 Elsevier Interactive Patient Education  2019 [employer] Inc.  

## 2018-09-21 NOTE — MAU Note (Signed)
Having a lot of pelvic pain and pressure tonight. Worse for last 2 hours. Denies vag bleeding. Earlier got out of car and back of pants wet but no further leaking of fld.

## 2018-10-14 ENCOUNTER — Telehealth (HOSPITAL_COMMUNITY): Payer: Self-pay | Admitting: *Deleted

## 2018-10-14 ENCOUNTER — Encounter (HOSPITAL_COMMUNITY): Payer: Self-pay | Admitting: *Deleted

## 2018-10-14 NOTE — Telephone Encounter (Signed)
Preadmission screen  

## 2018-10-15 ENCOUNTER — Encounter (HOSPITAL_COMMUNITY): Payer: Self-pay | Admitting: Emergency Medicine

## 2018-10-15 ENCOUNTER — Inpatient Hospital Stay (HOSPITAL_COMMUNITY)
Admission: AD | Admit: 2018-10-15 | Discharge: 2018-10-16 | Disposition: A | Discharge status: Home / Self Care | Payer: Medicaid Other | Attending: Obstetrics and Gynecology | Admitting: Obstetrics and Gynecology

## 2018-10-15 DIAGNOSIS — O471 False labor at or after 37 completed weeks of gestation: Secondary | ICD-10-CM | POA: Insufficient documentation

## 2018-10-15 DIAGNOSIS — O479 False labor, unspecified: Secondary | ICD-10-CM

## 2018-10-15 DIAGNOSIS — Z3A40 40 weeks gestation of pregnancy: Secondary | ICD-10-CM | POA: Insufficient documentation

## 2018-10-15 NOTE — MAU Note (Signed)
Pt reports contractions that started earlier today and are now every 3-4 mins. Denies LOF or vaginal bleeding. Reports good fetal movement. Cervix was 2-3cm today in the office.

## 2018-10-16 DIAGNOSIS — Z3A40 40 weeks gestation of pregnancy: Secondary | ICD-10-CM | POA: Diagnosis not present

## 2018-10-16 DIAGNOSIS — O471 False labor at or after 37 completed weeks of gestation: Secondary | ICD-10-CM | POA: Diagnosis present

## 2018-10-16 DIAGNOSIS — O479 False labor, unspecified: Secondary | ICD-10-CM

## 2018-10-17 ENCOUNTER — Other Ambulatory Visit: Payer: Self-pay | Admitting: Obstetrics and Gynecology

## 2018-10-18 ENCOUNTER — Encounter (HOSPITAL_COMMUNITY): Payer: Self-pay

## 2018-10-18 ENCOUNTER — Encounter (HOSPITAL_COMMUNITY)
Admission: AD | Disposition: A | Discharge status: Home / Self Care | Payer: Self-pay | Source: Home / Self Care | Attending: Obstetrics and Gynecology

## 2018-10-18 ENCOUNTER — Inpatient Hospital Stay (HOSPITAL_COMMUNITY)
Admission: AD | Admit: 2018-10-18 | Discharge: 2018-10-22 | DRG: 786 | Disposition: A | Discharge status: Home / Self Care | Payer: Medicaid Other | Attending: Obstetrics and Gynecology | Admitting: Obstetrics and Gynecology

## 2018-10-18 ENCOUNTER — Inpatient Hospital Stay (HOSPITAL_COMMUNITY): Payer: Medicaid Other | Admitting: Anesthesiology

## 2018-10-18 ENCOUNTER — Other Ambulatory Visit: Payer: Self-pay

## 2018-10-18 DIAGNOSIS — J45909 Unspecified asthma, uncomplicated: Secondary | ICD-10-CM | POA: Diagnosis present

## 2018-10-18 DIAGNOSIS — D62 Acute posthemorrhagic anemia: Secondary | ICD-10-CM | POA: Diagnosis not present

## 2018-10-18 DIAGNOSIS — Z3A40 40 weeks gestation of pregnancy: Secondary | ICD-10-CM | POA: Diagnosis not present

## 2018-10-18 DIAGNOSIS — O9081 Anemia of the puerperium: Secondary | ICD-10-CM | POA: Diagnosis not present

## 2018-10-18 DIAGNOSIS — O9952 Diseases of the respiratory system complicating childbirth: Secondary | ICD-10-CM | POA: Diagnosis present

## 2018-10-18 DIAGNOSIS — F329 Major depressive disorder, single episode, unspecified: Secondary | ICD-10-CM | POA: Diagnosis present

## 2018-10-18 DIAGNOSIS — R000 Tachycardia, unspecified: Secondary | ICD-10-CM | POA: Diagnosis present

## 2018-10-18 DIAGNOSIS — O411230 Chorioamnionitis, third trimester, not applicable or unspecified: Secondary | ICD-10-CM | POA: Diagnosis present

## 2018-10-18 DIAGNOSIS — O26893 Other specified pregnancy related conditions, third trimester: Secondary | ICD-10-CM | POA: Diagnosis present

## 2018-10-18 DIAGNOSIS — O99824 Streptococcus B carrier state complicating childbirth: Secondary | ICD-10-CM | POA: Diagnosis present

## 2018-10-18 DIAGNOSIS — O99344 Other mental disorders complicating childbirth: Secondary | ICD-10-CM | POA: Diagnosis present

## 2018-10-18 DIAGNOSIS — O480 Post-term pregnancy: Secondary | ICD-10-CM | POA: Diagnosis present

## 2018-10-18 DIAGNOSIS — Z87891 Personal history of nicotine dependence: Secondary | ICD-10-CM

## 2018-10-18 LAB — CBC
HCT: 32.4 % — ABNORMAL LOW (ref 36.0–46.0)
HCT: 23.5 % — ABNORMAL LOW (ref 36.0–46.0)
Hemoglobin: 10.3 g/dL — ABNORMAL LOW (ref 12.0–15.0)
Hemoglobin: 7.4 g/dL — ABNORMAL LOW (ref 12.0–15.0)
MCH: 24.1 pg — ABNORMAL LOW (ref 26.0–34.0)
MCH: 23.9 pg — ABNORMAL LOW (ref 26.0–34.0)
MCHC: 31.8 g/dL (ref 30.0–36.0)
MCHC: 31.5 g/dL (ref 30.0–36.0)
MCV: 75.9 fL — ABNORMAL LOW (ref 80.0–100.0)
MCV: 76.1 fL — ABNORMAL LOW (ref 80.0–100.0)
Platelets: 301 10*3/uL (ref 150–400)
Platelets: 250 10*3/uL (ref 150–400)
RBC: 4.27 MIL/uL (ref 3.87–5.11)
RBC: 3.09 MIL/uL — ABNORMAL LOW (ref 3.87–5.11)
RDW: 15.8 % — ABNORMAL HIGH (ref 11.5–15.5)
RDW: 15.9 % — AB (ref 11.5–15.5)
WBC: 14.2 10*3/uL — ABNORMAL HIGH (ref 4.0–10.5)
WBC: 22.1 10*3/uL — ABNORMAL HIGH (ref 4.0–10.5)
nRBC: 0 % (ref 0.0–0.2)
nRBC: 0 % (ref 0.0–0.2)

## 2018-10-18 LAB — TYPE AND SCREEN
ABO/RH(D): A POS
Antibody Screen: NEGATIVE

## 2018-10-18 LAB — CREATININE, SERUM
Creatinine, Ser: 0.7 mg/dL (ref 0.44–1.00)
GFR calc Af Amer: >60 mL/min (ref >60)
GFR calc non Af Amer: >60 mL/min (ref >60)

## 2018-10-18 LAB — POCT FERN TEST: POCT Fern Test: POSITIVE — AB

## 2018-10-18 LAB — RPR: RPR Ser Ql: NONREACTIVE

## 2018-10-18 SURGERY — Surgical Case
Anesthesia: Epidural | Site: Abdomen | Wound class: Clean Contaminated

## 2018-10-18 MED ORDER — SODIUM BICARBONATE 8.4 % IV SOLN
INTRAVENOUS | Status: DC | PRN
  Administered 2018-10-18: 4 mL via EPIDURAL
  Administered 2018-10-18: 5 mL via EPIDURAL
  Administered 2018-10-18: 4 mL via EPIDURAL
  Administered 2018-10-18 (×2): 5 mL via EPIDURAL

## 2018-10-18 MED ORDER — ENOXAPARIN SODIUM 40 MG/0.4ML ~~LOC~~ SOLN: 40.0000 mg | SUBCUTANEOUS | Status: DC

## 2018-10-18 MED ORDER — COCONUT OIL OIL: 1.0000 "application " | TOPICAL_OIL | Status: DC | PRN

## 2018-10-18 MED ORDER — MENTHOL 3 MG MT LOZG: 1.0000 | LOZENGE | OROMUCOSAL | Status: DC | PRN

## 2018-10-18 MED ORDER — OXYTOCIN 40 UNITS IN NORMAL SALINE INFUSION - SIMPLE MED: 2.5000 [IU]/h | INTRAVENOUS | Status: DC

## 2018-10-18 MED ORDER — OXYCODONE-ACETAMINOPHEN 5-325 MG PO TABS: 1.0000 | ORAL_TABLET | ORAL | Status: DC | PRN

## 2018-10-18 MED ORDER — ONDANSETRON HCL 4 MG/2ML IJ SOLN
INTRAMUSCULAR | Status: DC | PRN
  Administered 2018-10-18: 4 mg via INTRAVENOUS

## 2018-10-18 MED ORDER — NALBUPHINE HCL 10 MG/ML IJ SOLN: 5.0000 mg | Freq: Once | INTRAMUSCULAR | Status: DC | PRN

## 2018-10-18 MED ORDER — SODIUM CHLORIDE 0.9 % IV SOLN
3.0000 g | Freq: Four times a day (QID) | INTRAVENOUS | Status: AC
  Administered 2018-10-18 – 2018-10-19 (×2): 3 g via INTRAVENOUS
  Filled 2018-10-18 (×2): qty 3

## 2018-10-18 MED ORDER — DIBUCAINE 1 % RE OINT
1.0000 "application " | TOPICAL_OINTMENT | RECTAL | Status: DC | PRN
  Administered 2018-10-19: 1 via RECTAL
  Filled 2018-10-18: qty 28

## 2018-10-18 MED ORDER — LIDOCAINE HCL (PF) 1 % IJ SOLN
INTRAMUSCULAR | Status: DC | PRN
  Administered 2018-10-18 (×2): 4 mL via EPIDURAL

## 2018-10-18 MED ORDER — NALOXONE HCL 4 MG/10ML IJ SOLN: 1.0000 ug/kg/h | INTRAVENOUS | Status: DC | PRN

## 2018-10-18 MED ORDER — SODIUM CHLORIDE 0.9 % IR SOLN
Status: DC | PRN
  Administered 2018-10-18: 1

## 2018-10-18 MED ORDER — PENICILLIN G POTASSIUM 5000000 UNITS IJ SOLR: 2.5000 10*6.[IU] | INTRAVENOUS | Status: DC

## 2018-10-18 MED ORDER — LACTATED RINGERS IV SOLN: 500.0000 mL | Freq: Once | INTRAVENOUS | Status: DC

## 2018-10-18 MED ORDER — DIPHENHYDRAMINE HCL 50 MG/ML IJ SOLN: 12.5000 mg | INTRAMUSCULAR | Status: DC | PRN

## 2018-10-18 MED ORDER — STERILE WATER FOR IRRIGATION IR SOLN
Status: DC | PRN
  Administered 2018-10-18: 1

## 2018-10-18 MED ORDER — LACTATED RINGERS IV SOLN
INTRAVENOUS | Status: DC
  Administered 2018-10-18 (×4): via INTRAVENOUS

## 2018-10-18 MED ORDER — SODIUM CHLORIDE 0.9 % IV SOLN
3.0000 g | Freq: Four times a day (QID) | INTRAVENOUS | Status: DC
  Administered 2018-10-18 (×2): 3 g via INTRAVENOUS
  Filled 2018-10-18 (×4): qty 3

## 2018-10-18 MED ORDER — LIDOCAINE HCL (PF) 1 % IJ SOLN
30.0000 mL | INTRAMUSCULAR | Status: AC | PRN
  Administered 2018-10-18: 30 mL via SUBCUTANEOUS
  Filled 2018-10-18: qty 30

## 2018-10-18 MED ORDER — OXYTOCIN 40 UNITS IN NORMAL SALINE INFUSION - SIMPLE MED
INTRAVENOUS | Status: AC
  Filled 2018-10-18: qty 1000

## 2018-10-18 MED ORDER — FENTANYL 2.5 MCG/ML BUPIVACAINE 1/10 % EPIDURAL INFUSION (WH - ANES)
INTRAMUSCULAR | Status: AC
  Filled 2018-10-18: qty 100

## 2018-10-18 MED ORDER — MORPHINE SULFATE (PF) 0.5 MG/ML IJ SOLN
INTRAMUSCULAR | Status: DC | PRN
  Administered 2018-10-18: 3 mg via EPIDURAL

## 2018-10-18 MED ORDER — SOD CITRATE-CITRIC ACID 500-334 MG/5ML PO SOLN: 30.0000 mL | ORAL | Status: DC | PRN

## 2018-10-18 MED ORDER — MEPERIDINE HCL 25 MG/ML IJ SOLN: 6.2500 mg | INTRAMUSCULAR | Status: DC | PRN

## 2018-10-18 MED ORDER — LACTATED RINGERS IV SOLN
INTRAVENOUS | Status: DC
  Administered 2018-10-18 (×3): via INTRAVENOUS

## 2018-10-18 MED ORDER — PENICILLIN G 3 MILLION UNITS IVPB - SIMPLE MED
3.0000 10*6.[IU] | INTRAVENOUS | Status: DC
  Administered 2018-10-18: 3 10*6.[IU] via INTRAVENOUS

## 2018-10-18 MED ORDER — PHENYLEPHRINE 40 MCG/ML (10ML) SYRINGE FOR IV PUSH (FOR BLOOD PRESSURE SUPPORT)
PREFILLED_SYRINGE | INTRAVENOUS | Status: AC
  Filled 2018-10-18: qty 20

## 2018-10-18 MED ORDER — OXYTOCIN BOLUS FROM INFUSION: 500.0000 mL | Freq: Once | INTRAVENOUS | Status: DC

## 2018-10-18 MED ORDER — ACETAMINOPHEN 325 MG PO TABS
650.0000 mg | ORAL_TABLET | ORAL | Status: DC | PRN
  Administered 2018-10-18: 650 mg via ORAL
  Filled 2018-10-18: qty 2

## 2018-10-18 MED ORDER — NALBUPHINE HCL 10 MG/ML IJ SOLN: 5.0000 mg | INTRAMUSCULAR | Status: DC | PRN

## 2018-10-18 MED ORDER — OXYTOCIN 40 UNITS IN NORMAL SALINE INFUSION - SIMPLE MED: 2.5000 [IU]/h | INTRAVENOUS | Status: AC

## 2018-10-18 MED ORDER — SODIUM CHLORIDE 0.9 % IV SOLN
2.0000 g | Freq: Once | INTRAVENOUS | Status: AC
  Administered 2018-10-18: 2 g via INTRAVENOUS
  Filled 2018-10-18: qty 2

## 2018-10-18 MED ORDER — LACTATED RINGERS IV SOLN
500.0000 mL | INTRAVENOUS | Status: DC | PRN
  Administered 2018-10-18 (×3): 1000 mL via INTRAVENOUS

## 2018-10-18 MED ORDER — DEXAMETHASONE SODIUM PHOSPHATE 4 MG/ML IJ SOLN
INTRAMUSCULAR | Status: AC
  Filled 2018-10-18: qty 1

## 2018-10-18 MED ORDER — IBUPROFEN 600 MG PO TABS
600.0000 mg | ORAL_TABLET | Freq: Four times a day (QID) | ORAL | Status: DC | PRN
  Administered 2018-10-19 – 2018-10-22 (×3): 600 mg via ORAL
  Filled 2018-10-18: qty 1

## 2018-10-18 MED ORDER — EPHEDRINE 5 MG/ML INJ: 10.0000 mg | INTRAVENOUS | Status: DC | PRN

## 2018-10-18 MED ORDER — LIDOCAINE-EPINEPHRINE (PF) 2 %-1:200000 IJ SOLN
INTRAMUSCULAR | Status: AC
  Filled 2018-10-18: qty 20

## 2018-10-18 MED ORDER — SIMETHICONE 80 MG PO CHEW
80.0000 mg | CHEWABLE_TABLET | ORAL | Status: DC
  Administered 2018-10-18 – 2018-10-22 (×4): 80 mg via ORAL
  Filled 2018-10-18 (×4): qty 1

## 2018-10-18 MED ORDER — SODIUM CHLORIDE 0.9% FLUSH: 3.0000 mL | INTRAVENOUS | Status: DC | PRN

## 2018-10-18 MED ORDER — ONDANSETRON HCL 4 MG/2ML IJ SOLN
4.0000 mg | Freq: Four times a day (QID) | INTRAMUSCULAR | Status: DC | PRN
  Administered 2018-10-18: 4 mg via INTRAVENOUS
  Filled 2018-10-18: qty 2

## 2018-10-18 MED ORDER — FENTANYL CITRATE (PF) 100 MCG/2ML IJ SOLN: 25.0000 ug | INTRAMUSCULAR | Status: DC | PRN

## 2018-10-18 MED ORDER — OXYCODONE-ACETAMINOPHEN 5-325 MG PO TABS: 2.0000 | ORAL_TABLET | ORAL | Status: DC | PRN

## 2018-10-18 MED ORDER — PRENATAL MULTIVITAMIN CH
1.0000 | ORAL_TABLET | Freq: Every day | ORAL | Status: DC
  Administered 2018-10-21: 1 via ORAL
  Filled 2018-10-18 (×3): qty 1

## 2018-10-18 MED ORDER — OXYTOCIN 10 UNIT/ML IJ SOLN
INTRAMUSCULAR | Status: AC
  Filled 2018-10-18: qty 4

## 2018-10-18 MED ORDER — ONDANSETRON HCL 4 MG/2ML IJ SOLN
INTRAMUSCULAR | Status: AC
  Filled 2018-10-18: qty 2

## 2018-10-18 MED ORDER — KETOROLAC TROMETHAMINE 30 MG/ML IJ SOLN
INTRAMUSCULAR | Status: AC
  Filled 2018-10-18: qty 1

## 2018-10-18 MED ORDER — NALOXONE HCL 0.4 MG/ML IJ SOLN: 0.4000 mg | INTRAMUSCULAR | Status: DC | PRN

## 2018-10-18 MED ORDER — BUPIVACAINE HCL (PF) 0.25 % IJ SOLN
INTRAMUSCULAR | Status: DC | PRN
  Administered 2018-10-18: 20 mL

## 2018-10-18 MED ORDER — PENICILLIN G 3 MILLION UNITS IVPB - SIMPLE MED: 3.0000 10*6.[IU] | INTRAVENOUS | Status: DC

## 2018-10-18 MED ORDER — PHENYLEPHRINE 40 MCG/ML (10ML) SYRINGE FOR IV PUSH (FOR BLOOD PRESSURE SUPPORT): 80.0000 ug | PREFILLED_SYRINGE | INTRAVENOUS | Status: DC | PRN

## 2018-10-18 MED ORDER — KETOROLAC TROMETHAMINE 30 MG/ML IJ SOLN
30.0000 mg | Freq: Four times a day (QID) | INTRAMUSCULAR | Status: AC | PRN
  Administered 2018-10-18: 30 mg via INTRAVENOUS

## 2018-10-18 MED ORDER — LIDOCAINE HCL (PF) 1 % IJ SOLN
INTRAMUSCULAR | Status: AC
  Filled 2018-10-18: qty 30

## 2018-10-18 MED ORDER — DIPHENHYDRAMINE HCL 25 MG PO CAPS: 25.0000 mg | ORAL_CAPSULE | Freq: Four times a day (QID) | ORAL | Status: DC | PRN

## 2018-10-18 MED ORDER — FLEET ENEMA 7-19 GM/118ML RE ENEM: 1.0000 | ENEMA | Freq: Every day | RECTAL | Status: DC | PRN

## 2018-10-18 MED ORDER — MEASLES, MUMPS & RUBELLA VAC IJ SOLR: 0.5000 mL | Freq: Once | INTRAMUSCULAR | Status: DC

## 2018-10-18 MED ORDER — SODIUM CHLORIDE 0.9 % IV SOLN
5.0000 10*6.[IU] | Freq: Once | INTRAVENOUS | Status: DC
  Filled 2018-10-18: qty 5

## 2018-10-18 MED ORDER — DIPHENHYDRAMINE HCL 25 MG PO CAPS
25.0000 mg | ORAL_CAPSULE | ORAL | Status: DC | PRN
  Filled 2018-10-18: qty 1

## 2018-10-18 MED ORDER — SIMETHICONE 80 MG PO CHEW: 80.0000 mg | CHEWABLE_TABLET | ORAL | Status: DC | PRN

## 2018-10-18 MED ORDER — FENTANYL 2.5 MCG/ML BUPIVACAINE 1/10 % EPIDURAL INFUSION (WH - ANES)
14.0000 mL/h | INTRAMUSCULAR | Status: DC | PRN
  Administered 2018-10-18 (×3): 14 mL/h via EPIDURAL
  Filled 2018-10-18 (×2): qty 100

## 2018-10-18 MED ORDER — DEXAMETHASONE SODIUM PHOSPHATE 4 MG/ML IJ SOLN
INTRAMUSCULAR | Status: DC | PRN
  Administered 2018-10-18: 4 mg via INTRAVENOUS

## 2018-10-18 MED ORDER — BUPIVACAINE HCL (PF) 0.25 % IJ SOLN
INTRAMUSCULAR | Status: AC
  Filled 2018-10-18: qty 30

## 2018-10-18 MED ORDER — MORPHINE SULFATE (PF) 0.5 MG/ML IJ SOLN
INTRAMUSCULAR | Status: AC
  Filled 2018-10-18: qty 10

## 2018-10-18 MED ORDER — METHYLERGONOVINE MALEATE 0.2 MG PO TABS: 0.2000 mg | ORAL_TABLET | ORAL | Status: DC | PRN

## 2018-10-18 MED ORDER — DIPHENHYDRAMINE HCL 50 MG/ML IJ SOLN
12.5000 mg | INTRAMUSCULAR | Status: DC | PRN
  Administered 2018-10-18: 12.5 mg via INTRAVENOUS
  Filled 2018-10-18: qty 1

## 2018-10-18 MED ORDER — KETOROLAC TROMETHAMINE 30 MG/ML IJ SOLN: 30.0000 mg | Freq: Four times a day (QID) | INTRAMUSCULAR | Status: AC | PRN

## 2018-10-18 MED ORDER — WITCH HAZEL-GLYCERIN EX PADS
1.0000 "application " | MEDICATED_PAD | CUTANEOUS | Status: DC | PRN
  Administered 2018-10-19 – 2018-10-21 (×2): 1 via TOPICAL

## 2018-10-18 MED ORDER — HYDROCODONE-ACETAMINOPHEN 5-325 MG PO TABS
1.0000 | ORAL_TABLET | ORAL | Status: DC | PRN
  Administered 2018-10-20: 2 via ORAL
  Filled 2018-10-18: qty 2

## 2018-10-18 MED ORDER — FERROUS SULFATE 325 (65 FE) MG PO TABS
325.0000 mg | ORAL_TABLET | Freq: Two times a day (BID) | ORAL | Status: DC
  Administered 2018-10-19 – 2018-10-22 (×7): 325 mg via ORAL
  Filled 2018-10-18 (×7): qty 1

## 2018-10-18 MED ORDER — SIMETHICONE 80 MG PO CHEW
80.0000 mg | CHEWABLE_TABLET | Freq: Three times a day (TID) | ORAL | Status: DC
  Administered 2018-10-19 – 2018-10-22 (×8): 80 mg via ORAL
  Filled 2018-10-18 (×8): qty 1

## 2018-10-18 MED ORDER — PHENYLEPHRINE 40 MCG/ML (10ML) SYRINGE FOR IV PUSH (FOR BLOOD PRESSURE SUPPORT)
80.0000 ug | PREFILLED_SYRINGE | INTRAVENOUS | Status: DC | PRN
  Filled 2018-10-18: qty 10

## 2018-10-18 MED ORDER — ONDANSETRON HCL 4 MG/2ML IJ SOLN
4.0000 mg | Freq: Three times a day (TID) | INTRAMUSCULAR | Status: DC | PRN
  Administered 2018-10-18: 4 mg via INTRAVENOUS
  Filled 2018-10-18: qty 2

## 2018-10-18 MED ORDER — TETANUS-DIPHTH-ACELL PERTUSSIS 5-2.5-18.5 LF-MCG/0.5 IM SUSP: 0.5000 mL | Freq: Once | INTRAMUSCULAR | Status: DC

## 2018-10-18 MED ORDER — ZOLPIDEM TARTRATE 5 MG PO TABS: 5.0000 mg | ORAL_TABLET | Freq: Every evening | ORAL | Status: DC | PRN

## 2018-10-18 MED ORDER — OXYTOCIN 10 UNIT/ML IJ SOLN
INTRAVENOUS | Status: DC | PRN
  Administered 2018-10-18: 40 [IU] via INTRAVENOUS

## 2018-10-18 MED ORDER — LACTATED RINGERS IV SOLN
INTRAVENOUS | Status: DC
  Administered 2018-10-19: via INTRAVENOUS

## 2018-10-18 MED ORDER — SENNOSIDES-DOCUSATE SODIUM 8.6-50 MG PO TABS
2.0000 | ORAL_TABLET | ORAL | Status: DC
  Administered 2018-10-18 – 2018-10-22 (×4): 2 via ORAL
  Filled 2018-10-18 (×4): qty 2

## 2018-10-18 MED ORDER — METHYLERGONOVINE MALEATE 0.2 MG/ML IJ SOLN: 0.2000 mg | INTRAMUSCULAR | Status: DC | PRN

## 2018-10-18 SURGICAL SUPPLY — 44 items
APL SKNCLS STERI-STRIP NONHPOA (GAUZE/BANDAGES/DRESSINGS) ×1
BENZOIN TINCTURE PRP APPL 2/3 (GAUZE/BANDAGES/DRESSINGS) ×3 IMPLANT
CHLORAPREP W/TINT 26ML (MISCELLANEOUS) ×3 IMPLANT
CLAMP CORD UMBIL (MISCELLANEOUS) IMPLANT
CLOSURE STERI STRIP 1/2 X4 (GAUZE/BANDAGES/DRESSINGS) ×1 IMPLANT
CLOSURE WOUND 1/2 X4 (GAUZE/BANDAGES/DRESSINGS) ×1
CLOTH BEACON ORANGE TIMEOUT ST (SAFETY) ×3 IMPLANT
DRSG OPSITE POSTOP 4X10 (GAUZE/BANDAGES/DRESSINGS) ×3 IMPLANT
ELECT REM PT RETURN 9FT ADLT (ELECTROSURGICAL) ×3
ELECTRODE REM PT RTRN 9FT ADLT (ELECTROSURGICAL) ×1 IMPLANT
EXTRACTOR VACUUM BELL STYLE (SUCTIONS) IMPLANT
EXTRACTOR VACUUM KIWI (MISCELLANEOUS) IMPLANT
GAUZE SPONGE 4X4 12PLY STRL LF (GAUZE/BANDAGES/DRESSINGS) ×4 IMPLANT
GLOVE BIO SURGEON STRL SZ 6.5 (GLOVE) ×2 IMPLANT
GLOVE BIO SURGEONS STRL SZ 6.5 (GLOVE) ×1
GLOVE BIOGEL PI IND STRL 7.0 (GLOVE) ×2 IMPLANT
GLOVE BIOGEL PI INDICATOR 7.0 (GLOVE) ×4
GOWN STRL REUS W/TWL LRG LVL3 (GOWN DISPOSABLE) ×9 IMPLANT
KIT ABG SYR 3ML LUER SLIP (SYRINGE) IMPLANT
NDL HYPO 25X5/8 SAFETYGLIDE (NEEDLE) IMPLANT
NEEDLE HYPO 22GX1.5 SAFETY (NEEDLE) IMPLANT
NEEDLE HYPO 25X5/8 SAFETYGLIDE (NEEDLE) IMPLANT
NS IRRIG 1000ML POUR BTL (IV SOLUTION) ×3 IMPLANT
PACK C SECTION WH (CUSTOM PROCEDURE TRAY) ×3 IMPLANT
PAD ABD 7.5X8 STRL (GAUZE/BANDAGES/DRESSINGS) ×2 IMPLANT
PAD OB MATERNITY 4.3X12.25 (PERSONAL CARE ITEMS) ×3 IMPLANT
PENCIL SMOKE EVAC W/HOLSTER (ELECTROSURGICAL) ×3 IMPLANT
RTRCTR C-SECT PINK 25CM LRG (MISCELLANEOUS) ×3 IMPLANT
SPONGE LAP 18X18 X RAY DECT (DISPOSABLE) ×2 IMPLANT
STRIP CLOSURE SKIN 1/2X4 (GAUZE/BANDAGES/DRESSINGS) ×2 IMPLANT
SUT CHROMIC 2 0 CT 1 (SUTURE) ×6 IMPLANT
SUT MNCRL 0 VIOLET CTX 36 (SUTURE) ×2 IMPLANT
SUT MONOCRYL 0 CTX 36 (SUTURE) ×4
SUT PDS AB 0 CTX 36 PDP370T (SUTURE) IMPLANT
SUT PLAIN 2 0 (SUTURE)
SUT PLAIN 2 0 XLH (SUTURE) ×2 IMPLANT
SUT PLAIN ABS 2-0 CT1 27XMFL (SUTURE) IMPLANT
SUT VIC AB 0 CTX 36 (SUTURE) ×6
SUT VIC AB 0 CTX36XBRD ANBCTRL (SUTURE) ×2 IMPLANT
SUT VIC AB 4-0 KS 27 (SUTURE) ×3 IMPLANT
SYR CONTROL 10ML LL (SYRINGE) IMPLANT
TOWEL OR 17X24 6PK STRL BLUE (TOWEL DISPOSABLE) ×3 IMPLANT
TRAY FOLEY W/BAG SLVR 14FR LF (SET/KITS/TRAYS/PACK) ×3 IMPLANT
WATER STERILE IRR 1000ML POUR (IV SOLUTION) ×3 IMPLANT

## 2018-10-18 NOTE — Progress Notes (Signed)
After numerous complaints about discomfort with urethral catheter, pt requested it to be removed.  Explained to patient that it is necessary because of epidural placement. Without indwelling catheter in place, the only other option is an in/out catheter.  Educated patient on the risk of infection and possibly having to have an indwelling replaced.  Pt verbalized understanding and still requested it be removed.

## 2018-10-18 NOTE — Anesthesia Postprocedure Evaluation (Signed)
Anesthesia Post Note  Patient: Gloria Berg  Procedure(s) Performed: CESAREAN SECTION (N/A Abdomen)     Patient location during evaluation: PACU Anesthesia Type: Epidural Level of consciousness: awake and alert Pain management: pain level controlled Vital Signs Assessment: post-procedure vital signs reviewed and stable Respiratory status: spontaneous breathing, nonlabored ventilation and respiratory function stable Cardiovascular status: stable Postop Assessment: no headache, no backache and epidural receding Anesthetic complications: no    Last Vitals:  Vitals:   10/18/18 2130 10/18/18 2230  BP: 128/70 116/70  Pulse: (!) 101 90  Resp: 18 18  Temp: 37 C 36.5 C  SpO2: 98% 98%    Last Pain:  Vitals:   10/18/18 2230  TempSrc: Oral  PainSc: 0-No pain   Pain Goal:                   Chelsey L Woodrum

## 2018-10-18 NOTE — Progress Notes (Signed)
Labor Progress Note Gloria Berg is a 23 y.o. female, G1P0 at 40+6 weeks, presenting for active labor and SROM Upon checking on Gloria Berg I found her s/p epidural and complaining of rectal pressure so I rechecked her cervix and found her less than 7 or 8 reported by MAU: 5/90/-2 at 0353.   Subjective:   Gloria Berg reports that her contraction pain, which was previously 9 out of 10, is currently 4 out of 10 during contractions.   Objective:  BP 124/84   Pulse (!) 105   Resp 18   Ht 5\' 3"  (1.6 m)   Wt 84.9 kg   LMP 01/05/2018   SpO2 100%   BMI 33.17 kg/m     FHT: Category 1 UC:   5 in 10 minutes, lasting 90 seconds SVE:   5/90/-2 Pitocin at n/a MVUs n/a  Assessment/Plan:  Fetal Wellbeing: Cat 1 Labor:  Active labor, newly admitted. ROM x 3h. Preeclampsia:  normotensivve GBS:   Pos, NKDA, on Ampicillin, 1st dose 0325 Pain Control:  epidural Anticipated MOD:  SVD    Jonetta Speak, CNM 10/18/2018, 4:48 AM

## 2018-10-18 NOTE — Progress Notes (Signed)
Labor Progress Note  Per HPI: Gloria Berg is a 23 y.o. female, G1P0 at 40+6 weeks, presenting for active labor and SROM. She had a low risk pregnancy and presents to MAU 7.5/90/-2, asking for epidural. She was scheduled IOL for this morning at 0800 for postdates. PMH includes depression and asthma.  Subjective: Pt exhausted in bed, mental fatigued, attempted to push for 15 mins when pt was complete @ 1405, fetal tachycardia was noted at this time, baseline of 170s, pt needed to take a break, then regained strength and pushed for another 30 mins, but fell exhausted again, pt was teary eyes, and c/o pain in her back, pushed for another 2 hours and 30 mins to total 3 hour and 15 mins of pushing, pt was unable to move fetus past +1, and unable to continue to push due to maternal exhausted therefore 1602 Dr Estanislado Pandy notified of pt status and instructed me to call Dr Mora Appl whom was covering for her, Dr Estanislado Pandy was in a procedure, I called Dr Mora Appl @ [phone] and notified her I needed her at bedside for consult for forceps. Dr Mora Appl was in room assess and toot over care of 1622.  Patient Active Problem List   Diagnosis Date Noted  . Normal labor 10/18/2018   Objective: BP (!) 113/51   Pulse (!) 114   Temp 100.1 F (37.8 C) (Axillary)   Resp 18   Ht 5\' 3"  (1.6 m)   Wt 84.9 kg   LMP 01/05/2018   SpO2 99%   BMI 33.17 kg/m  I/O last 3 completed shifts: In: -  Out: 153 [Urine:153] No intake/output data recorded. NST: FHR baseline 170s bpm, Variability: moderate, Accelerations:not present, Decelerations:  Absent= Cat 1/Reactive CTX:  regular, every 1-3.5 minutes, lasting 70-100 seconds Uterus gravid, soft non tender, moderate to palpate with contractions.  SVE:  Dilation: 10 Effacement (%): 100 Station: Plus 1 Exam by:: [facility], CNM Pitocin at (None) mUn/min  Assessment:  Gloria Berg is a 23 y.o. female, G1P0 at 40+6 weeks, presenting for active labor and SROM. She had a low risk  pregnancy and presents to MAU 7.5/90/-2, asking for epidural. She was scheduled IOL for this morning at 0800 for postdates. PMH includes depression and asthma. Pt stable with rectal pressure and urges to push, pt trial push but no fetal descent, OP positioning noted, pt left on left side with peanut ball. Suspected chori, on unasyn , Pushed for 3 hours 15 mins, consulted and Dr Mora Appl in room assess for forceps due to fetus at +1 station with maternal exhaustion.   Patient Active Problem List   Diagnosis Date Noted  . Normal labor 10/18/2018   NICHD: Category 1  Membranes:  SROM on 2/07 @ 0400x 11hrs, s/s of infection  maternal temp 101, vagina feels warm.   Asynclitic position OP, fetal caput noted post hands and knees positioning.   Pain management:                Epidural placement:  at 2/7 on 0400  GBS positive  Abx: x1 dose ampicillin due to RN stated 9cm in MAU, the x1 dose pen, due to pt only being 6 cm, now starting unasyn for suspected chorio.  Maternal tachycardia: Fluid bolus given with no improvement, HR 120s, tylenol given 650mg   Plan: Continue labor plan Continuous/intermittent monitoring Left side with peanut and labor down.  Anticipate forceps delivery    Md Rivard aware of plan and verbalized agreement.  Dr Mora ApplPinn in room now assessing for forceps.   Dale DurhamJade Lorrain Rivers, NP-C, CNM, MSN 10/18/2018. 4:22 PM

## 2018-10-18 NOTE — H&P (Addendum)
Gloria Berg is a 23 y.o. female, G1P0 at 40+6 weeks, presenting for active labor and SROM. She had a low risk pregnancy and presents to MAU 7.5/90/-2, asking for epidural. She was scheduled IOL for this morning at 0800 for postdates. PMH includes depression and asthma.  Patient Active Problem List   Diagnosis Date Noted  . Normal labor 10/18/2018    History of present pregnancy: Patient entered care at 10 weeks.   EDC of 10/12/18 was established by LMP cc/w 10wk [place].   Anatomy scan:  19+5 weeks, with normal findings and a fundal placenta.   Additional [place] evaluations:  none.   Significant prenatal events:  none Last evaluation: 10/12/18 ROB  OB History    Gravida  1   Para      Term      Preterm      AB      Living        SAB      TAB      Ectopic      Multiple      Live Births             Past Medical History:  Diagnosis Date  . Asthma   . Depression   . Medical history non-contributory   . Urinary tract infection    Past Surgical History:  Procedure Laterality Date  . NO PAST SURGERIES     Family History: family history includes Anxiety disorder in her father; Drug abuse in her maternal aunt, maternal grandfather, maternal uncle, and mother. Social History:  reports that she quit smoking about 8 months ago. Her smoking use included cigarettes. She has never used smokeless tobacco. She reports current drug use. Drug: Marijuana. She reports that she does not drink alcohol. Results for orders placed or performed during the hospital encounter of 10/18/18 (from the past 24 hour(s))  CBC     Status: Abnormal   Collection Time: 10/18/18  3:07 AM  Result Value Ref Range   WBC 14.2 (H) 4.0 - 10.5 K/uL   RBC 4.27 3.87 - 5.11 MIL/uL   Hemoglobin 10.3 (L) 12.0 - 15.0 g/dL   HCT 42.3 (L) 95.3 - 20.2 %   MCV 75.9 (L) 80.0 - 100.0 fL   MCH 24.1 (L) 26.0 - 34.0 pg   MCHC 31.8 30.0 - 36.0 g/dL   RDW 33.4 (H) 35.6 - 86.1 %   Platelets 301 150 - 400 K/uL   nRBC 0.0  0.0 - 0.2 %  Fern Test     Status: Abnormal   Collection Time: 10/18/18  3:14 AM  Result Value Ref Range   POCT Fern Test Positive = ruptured amniotic membanes (A)     Prenatal Transfer Tool  Maternal Diabetes: No Genetic Screening: Declined Maternal Ultrasounds/Referrals: Normal Fetal Ultrasounds or other Referrals:  Maternal Substance Abuse:  No Significant Maternal Medications:  None Significant Maternal Lab Results: Lab values include: Group B Strep positive  TDAP declined Flu declined  ROS:  All ten systems reviewed and negative, except as noted. Denies VB.  Denies headache, epigastric pain, and visual sx. Reports +FM.  No Known Allergies   Dilation: 7.5 Effacement (%): 90 Station: -2 Exam by:: T Lytle RN  Height 5\' 3"  (1.6 m), weight 84.9 kg, last menstrual period 01/05/2018.  Chest clear Heart RRR without murmur Abd gravid, NT Pelvic: adequate Ext: No signs or symptoms of DVT  FHR: Category 2: moderate variability and accels present but occasional mild variable decels  UCs:  5 in 10 minutes  Prenatal labs: ABO, Rh: A/Positive/-- (07/12 0000) Antibody: Negative (07/12 0000) Rubella:  Immune (07/12 0000) RPR: Nonreactive (07/12 0000)  HBsAg: Negative (07/12 0000)  HIV: Non-reactive (07/12 0000)  GBS: Positive (01/10 0000) Sickle cell/Hgb electrophoresis:  AA GC:  neg Chlamydia:  neg Genetic screenings:  declined Glucola:  96 Hgb 13.4 at NOB, 11.5 at 28 weeks   Assessment: 23 y.o., G1P0 at 40+6 weeks Active labor 7.5/90/-2 ROM x 1h  Plan: Admit to [facility] per consult with Dr. Sallye Ober Routine [facility] orders Epidural now PCN G for GBS prophylaxis  Jonetta Speak, CNM 10/18/2018, 3:22 AM

## 2018-10-18 NOTE — MAU Note (Signed)
SROM at 0210-clear fluid.  Some scant vaginal bleeding.  Unsure whether shes feeling baby move much.  CTX every few minutes.

## 2018-10-18 NOTE — Anesthesia Pain Management Evaluation Note (Signed)
  CRNA Pain Management Visit Note  Patient: Gloria Berg, 23 y.o., female  "Hello I am a [facility] the [facility]. We have an anesthesia team available at all times to provide care throughout the hospital, including epidural management and anesthesia for C-section. I don't know your plan for the delivery whether it a natural birth, water birth, IV sedation, nitrous supplementation, doula or epidural, but we want to meet your pain goals."   1.Was your pain managed to your expectations on prior hospitalizations?   No prior hospitalizations  2.What is your expectation for pain management during this hospitalization?     Epidural  3.How can we help you reach that goal?   Record the patient's initial score and the patient's pain goal.   Pain: 0  Pain Goal: 6 [facility] wants you to be able to say your pain was always managed very well.  Laban EmperorMalinova,Berwyn Bigley Hristova 10/18/2018

## 2018-10-18 NOTE — Op Note (Signed)
Preoperative diagnosis: Intrauterine pregnancy at 40 weeks and 6 days, chorioamnionitis, failure to descend, failed forceps  Post operative diagnosis: Same  Anesthesia: Epidura  Anesthesiologist: Dr. Malen Gauze  Procedure: Primary low transverse cesarean section  Surgeon: Dr. Dois Davenport Willman Cuny  Assistant: Dale Penbrook CNM  Quantitated blood loss: 312 cc   Timeline: I was called by CNM at 16:00 for delivery assistance with patient actively pushing for 3 hours with minimal/no progress. FHR 180s. Patient receiving Unasyn for maternal fever. Since I was in a procedure, I instructed CNM to call Dr Mora Appl who was covering for me during surgery. I arrived on L&D at 16:50. Dr Mora Appl was at bedside and had attempted forceps delivery with no movement of the fetal head. She placed in a Foley catheter and repaired the 2nd degree perineal laceration. Patient was consented for cesarean section. R&B and questions answered.  Procedure:  After being informed of the planned procedure and possible complications including bleeding, infection, injury to other organs, informed consent is obtained. The patient is taken to OR #9 and pre-existing epidural  Anesthesia was optimized  without complication. She is placed in the dorsal decubitus position with the pelvis tilted to the left. She is then prepped and draped in a sterile fashion. A Foley catheter is  in her bladder.  After assessing adequate level of anesthesia, we infiltrate the suprapubic area with 20 cc of Marcaine 0.25 and perform a Pfannenstiel incision which is brought down sharply to the fascia. The fascia is entered in a low transverse fashion. Linea alba is dissected. Peritoneum is entered in a midline fashion. An Alexis retractor is easily positioned.   The myometrium is then entered in a low transverse fashion, 2 cm above the vesico-uterine junction ; first with knife and then extended bluntly. Amniotic fluid is thick meconium. We assist the birth of a female   infant in ROP presentation. Mouth and nose are suctioned. The baby is delivered. The cord is clamped and sectioned. The baby is given to the neonatologist present in the room.  10 cc of blood is drawn from the umbilical vein.The placenta is allowed to deliver spontaneously. It is complete and the cord has 3 vessels. Uterine revision is negative.  We proceed with closure of the myometrium in 2 layers: First with a running locked suture of 0 Vicryl, then with a Lembert suture of 0 Vicryl imbricating the first one. Hemostasis is completed with cauterization on peritoneal edges.  Both paracolic gutters are cleaned. Both tubes and ovaries are assessed and normal. The pelvis is profusely irrigated with warm saline to confirm a satisfactory hemostasis.  Retractors and sponges are removed. Under fascia hemostasis is completed with cauterization. The fascia is then closed with 2 running sutures of 0 Vicryl meeting midline. The wound is irrigated with warm saline and hemostasis is completed with cauterization. The subcutaneous layer is closed with interrupted sutures of 2-0 PlainThe skin is closed with a subcuticular suture of 3-0 Monocryl and Steri-Strips.  Instrument and sponge count is complete x2. Quantitated blood loss is 312 cc.  The procedure is well tolerated by the patient who is taken to recovery room in a well and stable condition.  female baby named Romero Liner was born at 17:31 and received an Apgar of 6  at 1 minute and 7 at 5 minutes. Cord pH 7.19   Specimen: Placenta sent to pathology   Crist Fat Geoff Dacanay MD 2/7/20206:13 PM

## 2018-10-18 NOTE — Anesthesia Procedure Notes (Signed)
Epidural Patient location during procedure: OB Start time: 10/18/2018 3:35 AM End time: 10/18/2018 3:38 AM  Staffing Anesthesiologist: Kaylyn Layer, MD Performed: anesthesiologist   Preanesthetic Checklist Completed: patient identified, pre-op evaluation, timeout performed, IV checked, risks and benefits discussed and monitors and equipment checked  Epidural Patient position: sitting Prep: site prepped and draped and DuraPrep Patient monitoring: continuous pulse ox, blood pressure, heart rate and cardiac monitor Approach: midline Location: L3-L4 Injection technique: LOR air  Needle:  Needle type: Tuohy  Needle gauge: 17 G Needle length: 9 cm Needle insertion depth: 7 cm Catheter type: closed end flexible Catheter size: 19 Gauge Catheter at skin depth: 12 cm Test dose: negative and Other (1% lidocaine)  Assessment Events: blood not aspirated, injection not painful, no injection resistance, negative IV test and no paresthesia  Additional Notes Patient identified. Risks, benefits, and alternatives discussed with patient including but not limited to bleeding, infection, nerve damage, paralysis, failed block, incomplete pain control, headache, blood pressure changes, nausea, vomiting, reactions to medication, itching, and postpartum back pain. Confirmed with bedside nurse the patient's most recent platelet count. Confirmed with patient that they are not currently taking any anticoagulation, have any bleeding history, or any family history of bleeding disorders. Patient expressed understanding and wished to proceed. All questions were answered. Sterile technique was used throughout the entire procedure. Crisp LOR on first pass. Please see nursing notes for vital signs. Test dose was given through epidural catheter and negative prior to continuing to dose epidural or start infusion. Warning signs of high block given to the patient including shortness of breath, tingling/numbness in hands,  complete motor block, or any concerning symptoms with instructions to call for help. Patient was given instructions on fall risk and not to get out of bed. All questions and concerns addressed with instructions to call with any issues or inadequate analgesia.  Reason for block:procedure for pain

## 2018-10-18 NOTE — Transfer of Care (Signed)
Immediate Anesthesia Transfer of Care Note  Patient: Gloria Berg  Procedure(s) Performed: CESAREAN SECTION (N/A Abdomen)  Patient Location: PACU  Anesthesia Type:Epidural  Level of Consciousness: awake and alert   Airway & Oxygen Therapy: Patient Spontanous Breathing  Post-op Assessment: Report given to RN and Post -op Vital signs reviewed and stable  Post vital signs: Reviewed  Last Vitals:  Vitals Value Taken Time  BP 108/72 10/18/2018  6:22 PM  Temp    Pulse 125 10/18/2018  6:23 PM  Resp 23 10/18/2018  6:23 PM  SpO2 100 % 10/18/2018  6:23 PM  Vitals shown include unvalidated device data.  Last Pain:  Vitals:   10/18/18 1700  TempSrc:   PainSc: 0-No pain         Complications: No apparent anesthesia complications

## 2018-10-18 NOTE — Anesthesia Preprocedure Evaluation (Addendum)
Anesthesia Evaluation  Patient identified by MRN, date of birth, ID band Patient awake    Reviewed: Allergy & Precautions, Patient's Chart, lab work & pertinent test results  History of Anesthesia Complications Negative for: history of anesthetic complications  Airway Mallampati: II  TM Distance: >3 FB Neck ROM: Full    Dental  (+) Teeth Intact   Pulmonary asthma , former smoker,    Pulmonary exam normal breath sounds clear to auscultation       Cardiovascular negative cardio ROS Normal cardiovascular exam Rhythm:Regular Rate:Normal     Neuro/Psych negative neurological ROS     GI/Hepatic negative GI ROS, Neg liver ROS,   Endo/Other  negative endocrine ROS  Renal/GU negative Renal ROS     Musculoskeletal negative musculoskeletal ROS (+)   Abdominal   Peds  Hematology negative hematology ROS (+)   Anesthesia Other Findings Day of surgery medications reviewed with the patient.  Reproductive/Obstetrics (+) Pregnancy                             Anesthesia Physical Anesthesia Plan  ASA: II and emergent  Anesthesia Plan: Epidural   Post-op Pain Management:    Induction:   PONV Risk Score and Plan: Treatment may vary due to age or medical condition  Airway Management Planned: Natural Airway  Additional Equipment:   Intra-op Plan:   Post-operative Plan:   Informed Consent: I have reviewed the patients History and Physical, chart, labs and discussed the procedure including the risks, benefits and alternatives for the proposed anesthesia with the patient or authorized representative who has indicated his/her understanding and acceptance.       Plan Discussed with: CRNA  Anesthesia Plan Comments: (Patient for urgent C/Section for arrest of descent and failed forceps. Will use epidural for C/Section. M. Malen Gauze, MD)       Anesthesia Quick Evaluation

## 2018-10-18 NOTE — Progress Notes (Signed)
Labor Progress Note  Per HPI: Gloria Berg is a 23 y.o. female, G1P0 at 40+6 weeks, presenting for active labor and SROM. She had a low risk pregnancy and presents to MAU 7.5/90/-2, asking for epidural. She was scheduled IOL for this morning at 0800 for postdates. PMH includes depression and asthma.  Subjective: Pt in bed feeling urges to pushing and continuing to have rectal pressure, even with redosing of epidural, pt wants to push, reducible lip noted on vaginal exam, pt oushed for 15 mins and unable to make any movement, OP positioning suspected, pt encouraged to stop pushing, I placed pt in hands and knees for 10 mins, and in high goddess pose for 10 mins, now pt in left side with peanut ball to help facilitate fetus to rotate, pt instructed to not push, maternal tachycardia noted with temp of 99.2, pt feels warm to touch inside and out, at time fetal tachycardia is noted as well. Suspected chorio, consulted with Dr Estanislado Pandy, and instructed to stop the penicillin and start unasyn.  Patient Active Problem List   Diagnosis Date Noted  . Normal labor 10/18/2018   Objective: BP 136/77   Pulse (!) 125   Temp 99.2 F (37.3 C) (Axillary)   Resp 18   Ht 5\' 3"  (1.6 m)   Wt 84.9 kg   LMP 01/05/2018   SpO2 99%   BMI 33.17 kg/m  I/O last 3 completed shifts: In: -  Out: 153 [Urine:153] No intake/output data recorded. NST: FHR baseline 160s bpm, Variability: moderate, Accelerations:not present, Decelerations:  Absent= Cat 1/Reactive CTX:  regular, every 1-3.5 minutes, lasting 70-100 seconds Uterus gravid, soft non tender, moderate to palpate with contractions.  SVE:  Dilation: Lip/rim Effacement (%): 100 Station: Plus 1 Exam by:: [facility], CNM Pitocin at (None) mUn/min  Assessment:  Gloria Berg is a 23 y.o. female, G1P0 at 40+6 weeks, presenting for active labor and SROM. She had a low risk pregnancy and presents to MAU 7.5/90/-2, asking for epidural. She was scheduled IOL for  this morning at 0800 for postdates. PMH includes depression and asthma. Pt stable with rectal pressure and urges to push, pt trial push but no fetal descent, OP positioning noted, pt left on left side with peanut ball. Suspected chori, will d/c penicillin and start unasyn now per Dr Estanislado Pandy recommendations.  Patient Active Problem List   Diagnosis Date Noted  . Normal labor 10/18/2018   NICHD: Category 1  Membranes:  SROM on 2/07 @ 0400x 6hrs, s/s of infection  maternal temp 99.2, vagina feels warm.   Asynclitic position OP, fetal caput noted post hands and knees positioning.   Pain management:                Epidural placement:  at 2/7 on 0400  GBS positive  Abx: x1 dose ampicillin due to RN stated 9cm in MAU, the x1 dose pen, due to pt only being 6 cm, now starting unasyn for suspected chorio.  Maternal tachycardia: Fluid bolus given with no improvement, HR 120s   Plan: Continue labor plan Continuous/intermittent monitoring Rest Frequent position changes to facilitate fetal rotation and descent. Will reassess with cervical exam in 1 hour or earlier if necessary Left side with peanut and labor down.  Anticipate labor progression and vaginal delivery.   Md Rivard aware of plan and verbalized agreement.   Dale [place], NP-C, CNM, MSN 10/18/2018. 11:29 AM

## 2018-10-19 ENCOUNTER — Encounter (HOSPITAL_COMMUNITY): Payer: Self-pay | Admitting: *Deleted

## 2018-10-19 ENCOUNTER — Telehealth (HOSPITAL_COMMUNITY): Payer: Self-pay

## 2018-10-19 ENCOUNTER — Inpatient Hospital Stay (HOSPITAL_COMMUNITY): Admission: RE | Admit: 2018-10-19 | Payer: Medicaid Other | Source: Ambulatory Visit

## 2018-10-19 LAB — CBC
HCT: 20.3 % — ABNORMAL LOW (ref 36.0–46.0)
HCT: 21.7 % — ABNORMAL LOW (ref 36.0–46.0)
Hemoglobin: 6.5 g/dL — CL (ref 12.0–15.0)
Hemoglobin: 6.9 g/dL — CL (ref 12.0–15.0)
MCH: 24.3 pg — ABNORMAL LOW (ref 26.0–34.0)
MCH: 24.2 pg — ABNORMAL LOW (ref 26.0–34.0)
MCHC: 32 g/dL (ref 30.0–36.0)
MCHC: 31.8 g/dL (ref 30.0–36.0)
MCV: 76 fL — ABNORMAL LOW (ref 80.0–100.0)
MCV: 76.1 fL — ABNORMAL LOW (ref 80.0–100.0)
Platelets: 226 10*3/uL (ref 150–400)
Platelets: 248 10*3/uL (ref 150–400)
RBC: 2.67 MIL/uL — ABNORMAL LOW (ref 3.87–5.11)
RBC: 2.85 MIL/uL — ABNORMAL LOW (ref 3.87–5.11)
RDW: 16.1 % — AB (ref 11.5–15.5)
RDW: 16.1 % — ABNORMAL HIGH (ref 11.5–15.5)
WBC: 19.1 10*3/uL — ABNORMAL HIGH (ref 4.0–10.5)
WBC: 19.8 10*3/uL — ABNORMAL HIGH (ref 4.0–10.5)
nRBC: 0 % (ref 0.0–0.2)
nRBC: 0 % (ref 0.0–0.2)

## 2018-10-19 MED ORDER — IBUPROFEN 600 MG PO TABS
600.0000 mg | ORAL_TABLET | Freq: Three times a day (TID) | ORAL | Status: DC
  Administered 2018-10-19 – 2018-10-21 (×6): 600 mg via ORAL
  Filled 2018-10-19 (×8): qty 1

## 2018-10-19 MED ORDER — ACETAMINOPHEN 325 MG PO TABS
650.0000 mg | ORAL_TABLET | Freq: Four times a day (QID) | ORAL | Status: DC
  Administered 2018-10-19 – 2018-10-22 (×11): 650 mg via ORAL
  Filled 2018-10-19 (×11): qty 2

## 2018-10-19 MED ORDER — SODIUM CHLORIDE 0.9 % IV SOLN
510.0000 mg | Freq: Once | INTRAVENOUS | Status: AC
  Administered 2018-10-19: 510 mg via INTRAVENOUS
  Filled 2018-10-19: qty 17

## 2018-10-19 MED ORDER — OXYCODONE HCL 5 MG PO TABS
5.0000 mg | ORAL_TABLET | Freq: Four times a day (QID) | ORAL | Status: DC | PRN
  Administered 2018-10-20 – 2018-10-22 (×5): 5 mg via ORAL
  Filled 2018-10-19 (×6): qty 1

## 2018-10-19 NOTE — Anesthesia Postprocedure Evaluation (Signed)
Anesthesia Post Note  Patient: Gloria Berg  Procedure(s) Performed: CESAREAN SECTION (N/A Abdomen)     Patient location during evaluation: Mother Baby Anesthesia Type: Epidural Level of consciousness: awake and alert and oriented Pain management: satisfactory to patient Vital Signs Assessment: post-procedure vital signs reviewed and stable Respiratory status: respiratory function stable Cardiovascular status: stable Postop Assessment: no headache, no backache, epidural receding, patient able to bend at knees, no signs of nausea or vomiting and adequate PO intake Anesthetic complications: no    Last Vitals:  Vitals:   10/19/18 0041 10/19/18 0430  BP:  109/70  Pulse:  (!) 114  Resp:  18  Temp: 37 C 37 C  SpO2: 100%     Last Pain:  Vitals:   10/19/18 0830  TempSrc:   PainSc: 0-No pain   Pain Goal:                Epidural/Spinal Function Cutaneous sensation: Normal sensation (10/19/18 0830)  Arjay Jaskiewicz

## 2018-10-19 NOTE — Lactation Note (Signed)
This note was copied from a baby's chart. Lactation Consultation Note  Patient Name: Gloria Berg [id number]'[id number] Date: 10/19/2018  [name] now 23 hours old.  Mom reports she has breastfed him once in NICU and done some STS.  Mom has not initiated pumping with DEBP or started any hand expression.  Mom reports she did not know she was supposed to.  Mom does not have a DEBP for home use.  [facility] referral sent. Initiated Pumping with mom.  First showed her how to massage and hand express.  Colostrum easily expressed.  Then assisted with pumping with DEBP. Showed her how to pump in the initiate setting.  Urged her to pump 8-12 times day /every time her baby ate and was not breastfeeding for 15 minutes. Reviewed and left NICU booklet.  Urged mom to call lactation as needed.  Maternal Data    Feeding Feeding Type: Formula Nipple Type: Slow - flow(green)  LATCH Score                   Interventions    Lactation Tools Discussed/Used     Consult Status      [name] 10/19/2018, 7:54 PM

## 2018-10-19 NOTE — Progress Notes (Signed)
CSW acknowledges consult. CSW attempted to meet with MOB, however MOB was not in the room. CSW will attempt to visit with MOB at a later time.   Celso Sickle, LCSWA Clinical Social Worker [facility] Cell#: [phone]

## 2018-10-19 NOTE — Progress Notes (Addendum)
Subjective: Postpartum Day 1: Cesarean Delivery Patient reports incisional pain, tolerating PO and + flatus.  Some fatigue noted but has been up to NICU most of day  Objective: Vital signs in last 24 hours: Temp:  [97.5 F (36.4 C)-101.4 F (38.6 C)] 98.4 F (36.9 C) (02/08 0830) Pulse Rate:  [90-139] 103 (02/08 0830) Resp:  [16-28] 16 (02/08 0830) BP: (88-168)/(51-124) 96/76 (02/08 0830) SpO2:  [80 %-100 %] 99 % (02/08 0830)  Physical Exam:  General: alert, cooperative and mild distress Lochia: appropriate Uterine Fundus: firm Incision: pressure dressing on no old blood noted Perineum well approximated. DVT Evaluation: No evidence of DVT seen on physical exam.  Recent Labs    10/18/18 2142 10/19/18 0632  HGB 7.4* 6.5*  HCT 23.5* 20.3*    Assessment/Plan: Status post Cesarean section. Doing well postoperatively.  Anemia due to blood loss Discussed blood transfusion versus iron transfusion risk and benefits.  Pt would like to start with iron transfusion and see how she feels.  Will start oral iron as well at discharge. Continue current care.  Henderson Newcomer Prothero 10/19/2018, 10:46 AM  Patient seen and examined. Agree with above plan.. if no improvement with Iron transfusion and hgb continues to drop plan transfusion of pRBC's

## 2018-10-19 NOTE — Addendum Note (Signed)
Addendum  created 10/19/18 0903 by Graciela Husbands, CRNA   Charge Capture section accepted, Clinical Note Signed

## 2018-10-19 NOTE — Plan of Care (Signed)
Progressing appropriately. Encouraged to call for assistance as needed. Safety information and room orientation complete.

## 2018-10-20 ENCOUNTER — Encounter (HOSPITAL_COMMUNITY): Payer: Self-pay | Admitting: Obstetrics and Gynecology

## 2018-10-20 LAB — CBC WITH DIFFERENTIAL/PLATELET
Basophils Absolute: 0 10*3/uL (ref 0.0–0.1)
Basophils Relative: 0 %
EOS ABS: 0.2 10*3/uL (ref 0.0–0.5)
Eosinophils Relative: 1 %
HCT: 21.3 % — ABNORMAL LOW (ref 36.0–46.0)
Hemoglobin: 6.8 g/dL — CL (ref 12.0–15.0)
Lymphocytes Relative: 12 %
Lymphs Abs: 2 10*3/uL (ref 0.7–4.0)
MCH: 24.4 pg — ABNORMAL LOW (ref 26.0–34.0)
MCHC: 31.9 g/dL (ref 30.0–36.0)
MCV: 76.3 fL — ABNORMAL LOW (ref 80.0–100.0)
MONOS PCT: 3 %
Monocytes Absolute: 0.6 10*3/uL (ref 0.1–1.0)
Neutro Abs: 14.1 10*3/uL — ABNORMAL HIGH (ref 1.7–7.7)
Neutrophils Relative %: 84 %
Platelets: 290 10*3/uL (ref 150–400)
RBC: 2.79 MIL/uL — ABNORMAL LOW (ref 3.87–5.11)
RDW: 16.5 % — AB (ref 11.5–15.5)
WBC: 16.8 10*3/uL — ABNORMAL HIGH (ref 4.0–10.5)
nRBC: 0 % (ref 0.0–0.2)

## 2018-10-20 NOTE — Lactation Note (Signed)
This note was copied from a baby's chart. Lactation Consultation Note  Patient Name: Gloria Berg [id number] Date: 10/20/2018 Reason for consult: Follow-up assessment;Term;Primapara;1st time breastfeeding  P1 mother whose infant is now 37 hours old.  Baby was in NICU and has now transitioned to mother's room.  Parents stated that baby had just arrived to their room approximately 5 minutes before I visited. They were getting ready to undress him and place him STS.  Mother has breast fed baby once in the NICU and we discussed putting baby to breast first now that he is rooming in.  Encouraged a lot of STS and to feed him 8-12 times/24 hours or sooner if baby shows cues.  Reviewed feeding cues.  Mother is familiar with hand expression and I encouraged this before/after feedings to help increase milk supply.  Mother will feed back any EBM she obtains with hand expression.  Mother [facility] breast feeding classes at [facility].  Suggested mother call for latch assistance at the next feeding so I can assess his feeding ability and provide instruction/guidance for mother.  Mother verbalized understanding and will call for latch assistance.    Mother has a DEBP for home use.  Father and family member present and supportive.   Maternal Data Formula Feeding for Exclusion: Yes Reason for exclusion: Mother's choice to formula and breast feed on admission Has patient been taught Hand Expression?: Yes Does the patient have breastfeeding experience prior to this delivery?: No  Feeding Feeding Type: Formula Nipple Type: Slow - flow  LATCH Score                   Interventions    Lactation Tools Discussed/Used WIC Program: Yes Initiated by:: Already initiated   Consult Status Consult Status: Follow-up Date: 10/21/18 Follow-up type: In-patient    Marika Mahaffy R Jemima Petko 10/20/2018, 1:01 PM

## 2018-10-20 NOTE — Progress Notes (Addendum)
CSW met with MOB in room 119 to complete an assessment for 14 on [place]. When CSW arrived, MOB had just showered and was getting ready for the day.  CSW explained CSW's role and MOB gave CSW permission to complete assessment while FOB present.MOB's Infant currently in the NICU.   FOB appeared very  supportive and communicated plan to continue to support MOB and infant postpartum. Per FOB "I have not left her side since the baby was born" " I feel that she is doing much better now"  MOB was friendly, easy to engage, and receptive to meeting with this CSW. Per MOB, her depression was mainly due to her inant being in the NICU. However with the news that the infant will room in today, she is feeling much better.    CSW provided education regarding Baby Blues vs PMADs and provided MOB with resources for mental health follow up. CSW encouraged MOB to evaluate her mental health throughout the postpartum period with the use of the New Mom Checklist developed by [facility] as well as the [place] Postnatal Depression Scale and notify a medical professional if symptoms arise. CSW assessed for safety and MOB denied, SI and HI. Patient admits to seeking mental health counseling in the past, and is open to following up with a therapist again if needed. MOB reported having all essential items and feeling prepared and excited to discharge home with her infant. Per MOB, " I am  feeling better than I have ever felt"  There are on barriers to discharge.  Elliot Gurney,  MSW, [employer] [phone]

## 2018-10-20 NOTE — Progress Notes (Addendum)
Gloria Berg [id number] Postpartum Postoperative Day # 2  [address] [place], G1P1001, [redacted]w[redacted]d, S/P Primary LT Cesarean Section due to failure to desced post complete and pushed for 3.30 hours.   Subjective: I assumed care of pt , pt sitting on side of the bed in NAD without complaints, pt was up most of the night visiting baby in the NICU. Baby Female is doing well and stable , plans to transfer to mother baby today, out pt circ desired. Patient up ad lib, denies syncope or dizziness. Reports consuming regular diet without issues and denies N/V. Patient reports 0 bowel movement + passing flatus.  Denies issues with urination and reports bleeding is "light."  Patient is Pumping feeding then breastfeeding when infant returns to room and reports going well.  Desires undecided for postpartum contraception.  Pain is being appropriately managed with use of po meds. Pt HGB prior to delivery was 10.3, dropped to 7.4 post delivery, next morning post#1 hgb was 6.5, pt declined a Blood transfusion and denies any s/sx besides fatigue which she attributes to being in the NICU all night , post iron infusion hgb was 6.9, CBC pending now. Pt denies s/sx of anemia, no CP, SOB, heart palpitations, now dizziness and endorses mild fatigue. Pt still declines blood transfusion. Pt received on POD#1 one single dose of ferumoxytol, pt may be referred to have another dose of needed 3-8 days post 1st dose. Pt still tachycardic but appears to be resolving, was 120s during delivery, currently 105s. Pt is in stable condition about to take a shower.    Objective: Patient Vitals for the past 24 hrs:  BP Temp Temp src Pulse Resp SpO2  10/19/18 2255 121/81 98 F (36.7 C) Oral (!) 108 17 -  10/19/18 1658 102/66 97.6 F (36.4 C) Oral (!) 107 18 98 %  10/19/18 1030 - - - - - 98 %  10/19/18 0830 96/76 98.4 F (36.9 C) Oral (!) 103 16 99 %     Physical Exam:  General: alert, cooperative, appears stated age and no  distress Mood/Affect: Happy Lungs: clear to auscultation, no wheezes, rales or rhonchi, symmetric air entry.  Heart: normal rate, regular rhythm, normal S1, S2, no murmurs, rubs, clicks or gallops. Breast: breasts appear normal, no suspicious masses, no skin or nipple changes or axillary nodes. Abdomen:  + bowel sounds, soft, non-tender Incision: healing well, no significant drainage, no dehiscence, Honeycomb dressing in place with island pressure dressing over.  Uterine Fundus: firm, involution -2 Lochia: appropriate Skin: Warm, Dry. DVT Evaluation: No evidence of DVT seen on physical exam. Negative Homan's sign. No cords or calf tenderness. No significant calf/ankle edema.  Labs: Recent Labs    10/18/18 2142 10/19/18 0632 10/19/18 1159  HGB 7.4* 6.5* 6.9*  HCT 23.5* 20.3* 21.7*  WBC 22.1* 19.1* 19.8*    CBG (last 3)  No results for input(s): GLUCAP in the last 72 hours.   I/O: I/O last 3 completed shifts: In: 1000.7 [I.V.:1000.7] Out: 4050 [Urine:4050]   Assessment Postpartum Postoperative Day # 2. [employer], G1P1001, [redacted]w[redacted]d, S/P Primary CS LT Cesarean Section due to failure of fetal decent post completion and pushing 3.30 hours, failed forcepts.  Pt stable. -2 Involution. Breat/pumpFeeding. Hemodynamically Stable, with anemia and HGB increase post transfusion of iron x1 day ago 6.5-6.9, denies alls/sx except fatigue which she contributes to being in NICU all night, baby female for out pt circ plane to be rooming in this after noon. Pt still tacxhycardic  but resolving HR now at 105. S/P suspected chorio and tx x2 during labor and x1 post op with unasyn, afebrile since.   Plan: Continue other mgmt as ordered Anemia: Iron BID with colace, CBC pending if continued to be low, pt has option for another IV iron infusion 3-8 days post the first one, declined blood transfusion  Tachycardia: Continue to monitor, hydrate, pending CBC, possible second IV outpt transfusion if no  resolved and cause from anemia.  VTE Prophylactics: SCD, ambulated as tolerates.  S/P Suspected chorio: Monitor for fever.  Pain control: Motrin/Tylenol/Narcotics PRN Baby Female: out pt circ, transfer to MB this afternoon.  Education given regarding options for contraception, including barrier methods, injectable contraception, IUD placement, oral contraceptives.  Plan for discharge tomorrow, Breastfeeding, Lactation consult and Contraception undecided  Dr. Richardson Dopp updated on patient status  [facility] NP-C, CNM 10/20/2018, 7:33 AM

## 2018-10-20 NOTE — Progress Notes (Signed)
Gloria Berg [id number] Postpartum Postoperative Day # 2  [address] [place], G1P1001, [redacted]w[redacted]d, S/P Primary LT Cesarean Section due to failure to desced post complete and pushed for 3.30 hours.   Subjective:  Pt stable. I rechecked on pt to assess her willing ness to have a blood transfusion and how she is feeling. Pt still feeling tired, but stated she feels good with newborn in room now. Pt BP was decreased at 0800 this morning 90/60s, but has since increased to 118/83, pt stable with cbc today of decrease from 6.9-6.8, pt s/p transfused x1 IV iron infusion on the 02/08. Pt still declines blood transfusion. No evidence of internal bleeding, honey comb CDI, trapped gas in abdomen, but feels better with passing gas. Pt endorses feeling slightly dizzy when standing up, but stated she believes she stood up to fast. Denies any other s/sx of anemia. No issues with urination. Mild incisional pain which percocet's takes care of the pain. Breastfeeding going well.   Objective: Patient Vitals for the past 24 hrs:  BP Temp Temp src Pulse Resp SpO2  10/20/18 1535 118/83 98.1 F (36.7 C) Oral (!) 108 18 -  10/20/18 0820 98/65 98.2 F (36.8 C) Oral (!) 103 18 -  10/19/18 2255 121/81 98 F (36.7 C) Oral (!) 108 17 -  10/19/18 1658 102/66 97.6 F (36.4 C) Oral (!) 107 18 98 %     Physical Exam:  General: alert, cooperative, appears stated age and no distress Mood/Affect: Happy Lungs: clear to auscultation, no wheezes, rales or rhonchi, symmetric air entry.  Heart: normal rate, regular rhythm, normal S1, S2, no murmurs, rubs, clicks or gallops. Breast: breasts appear normal, no suspicious masses, no skin or nipple changes or axillary nodes. Abdomen:  + bowel sounds, soft, non-tender Incision: healing well, no significant drainage, no dehiscence, Honeycomb dressing CDI Uterine Fundus: firm, involution -2 Lochia: appropriate Skin: Warm, Dry. DVT Evaluation: No evidence of DVT seen on physical  exam. Negative Homan's sign. No cords or calf tenderness. No significant calf/ankle edema.  Labs: Recent Labs    10/19/18 0632 10/19/18 1159 10/20/18 1206  HGB 6.5* 6.9* 6.8*  HCT 20.3* 21.7* 21.3*  WBC 19.1* 19.8* 16.8*    CBG (last 3)  No results for input(s): GLUCAP in the last 72 hours.   I/O: I/O last 3 completed shifts: In: 1000.7 [I.V.:1000.7] Out: 4050 [Urine:4050]   Assessment Postpartum Postoperative Day # 2. [employer], G1P1001, [redacted]w[redacted]d, S/P Primary CS LT Cesarean Section due to failure of fetal decent post completion and pushing 3.30 hours, failed forcepts.  Pt stable. I rechecked on pt to assess her willing ness to have a blood transfusion and how she is feeling. Pt still feeling tired, but stated she feels good with newborn in room now. Pt BP was decreased at 0800 this morning 90/60s, but has since increased to 118/83, pt stable with cbc today of decrease from 6.9-6.8, pt s/p transfused x1 IV iron infusion on the 02/08. Pt still declines blood transfusion. No evidence of internal bleeding, honey comb CDI, trapped gas in abdomen, but feels better with passing gas.   Plan: Continue other mgmt as ordered Anemia: Iron BID with colace, CBC pending if continued to be low, pt has option for another IV iron infusion 3-8 days post the first one, declined blood transfusion  Tachycardia: Continue to monitor, hydrate, pending CBC, possible second IV outpt transfusion if no resolved and cause from anemia.  VTE Prophylactics: SCD, ambulated as tolerates.  S/P Suspected chorio: Monitor for fever.  Pain control: Motrin/Tylenol/Narcotics PRN Baby Female: out pt circ, rooming in now.  Education given regarding options for contraception, including barrier methods, injectable contraception, IUD placement, oral contraceptives.  Plan for discharge tomorrow, Breastfeeding, Lactation consult and Contraception undecided  Dr. Richardson Dopp updated on patient status  [facility] NP-C,  CNM 10/20/2018, 4:16 PM

## 2018-10-21 LAB — CBC WITH DIFFERENTIAL/PLATELET
Basophils Absolute: 0 10*3/uL (ref 0.0–0.1)
Basophils Absolute: 0.1 10*3/uL (ref 0.0–0.1)
Basophils Relative: 0 %
Basophils Relative: 0 %
EOS PCT: 3 %
Eosinophils Absolute: 0.3 10*3/uL (ref 0.0–0.5)
Eosinophils Absolute: 0.5 10*3/uL (ref 0.0–0.5)
Eosinophils Relative: 4 %
HCT: 19.4 % — ABNORMAL LOW (ref 36.0–46.0)
HCT: 26.1 % — ABNORMAL LOW (ref 36.0–46.0)
Hemoglobin: 6.1 g/dL — CL (ref 12.0–15.0)
Hemoglobin: 8.4 g/dL — ABNORMAL LOW (ref 12.0–15.0)
LYMPHS ABS: 2.5 10*3/uL (ref 0.7–4.0)
Lymphocytes Relative: 20 %
Lymphocytes Relative: 20 %
Lymphs Abs: 2.7 10*3/uL (ref 0.7–4.0)
MCH: 24.1 pg — ABNORMAL LOW (ref 26.0–34.0)
MCH: 25.8 pg — ABNORMAL LOW (ref 26.0–34.0)
MCHC: 31.4 g/dL (ref 30.0–36.0)
MCHC: 32.2 g/dL (ref 30.0–36.0)
MCV: 76.7 fL — ABNORMAL LOW (ref 80.0–100.0)
MCV: 80.1 fL (ref 80.0–100.0)
MONOS PCT: 3 %
Monocytes Absolute: 0.4 10*3/uL (ref 0.1–1.0)
Monocytes Absolute: 0.5 10*3/uL (ref 0.1–1.0)
Monocytes Relative: 3 %
Neutro Abs: 9 10*3/uL — ABNORMAL HIGH (ref 1.7–7.7)
Neutro Abs: 10.1 10*3/uL (ref 1.7–7.7)
Neutrophils Relative %: 74 %
Neutrophils Relative %: 73 %
Platelets: 264 10*3/uL (ref 150–400)
Platelets: 354 10*3/uL (ref 150–400)
RBC: 2.53 MIL/uL — ABNORMAL LOW (ref 3.87–5.11)
RBC: 3.26 MIL/uL — ABNORMAL LOW (ref 3.87–5.11)
RDW: 16.5 % — ABNORMAL HIGH (ref 11.5–15.5)
RDW: 17.5 % — ABNORMAL HIGH (ref 11.5–15.5)
WBC: 12.1 10*3/uL — ABNORMAL HIGH (ref 4.0–10.5)
WBC: 13.8 10*3/uL — ABNORMAL HIGH (ref 4.0–10.5)
nRBC: 0 % (ref 0.0–0.2)
nRBC: 0.7 % — ABNORMAL HIGH (ref 0.0–0.2)

## 2018-10-21 LAB — PREPARE RBC (CROSSMATCH)

## 2018-10-21 MED ORDER — SODIUM CHLORIDE 0.9% IV SOLUTION: Freq: Once | INTRAVENOUS | Status: DC

## 2018-10-21 MED ORDER — DIPHENHYDRAMINE HCL 25 MG PO CAPS
25.0000 mg | ORAL_CAPSULE | Freq: Once | ORAL | Status: AC
  Administered 2018-10-21: 25 mg via ORAL
  Filled 2018-10-21: qty 1

## 2018-10-21 MED ORDER — ACETAMINOPHEN 325 MG PO TABS: 650.0000 mg | ORAL_TABLET | Freq: Once | ORAL | Status: DC

## 2018-10-21 NOTE — Discharge Instructions (Signed)
Cesarean Delivery, Care After This sheet gives you information about how to care for yourself after your procedure. Your health care provider may also give you more specific instructions. If you have problems or questions, contact your health care provider. What can I expect after the procedure? After the procedure, it is common to have:  A small amount of blood or clear fluid coming from the incision.  Some redness, swelling, and pain in your incision area.  Some abdominal pain and soreness.  Vaginal bleeding (lochia). Even though you did not have a vaginal delivery, you will still have vaginal bleeding and discharge.  Pelvic cramps.  Fatigue. You may have pain, swelling, and discomfort in the tissue between your vagina and your anus (perineum) if:  Your C-section was unplanned, and you were allowed to labor and push.  An incision was made in the area (episiotomy) or the tissue tore during attempted vaginal delivery. Follow these instructions at home: Incision care   Follow instructions from your health care provider about how to take care of your incision. Make sure you: ? Wash your hands with soap and water before you change your bandage (dressing). If soap and water are not available, use hand sanitizer. ? If you have a dressing, change it or remove it as told by your health care provider. ? Leave stitches (sutures), skin staples, skin glue, or adhesive strips in place. These skin closures may need to stay in place for 2 weeks or longer. If adhesive strip edges start to loosen and curl up, you may trim the loose edges. Do not remove adhesive strips completely unless your health care provider tells you to do that.  Check your incision area every day for signs of infection. Check for: ? More redness, swelling, or pain. ? More fluid or blood. ? Warmth. ? Pus or a bad smell.  Do not take baths, swim, or use a hot tub until your health care provider says it's okay. Ask your health  care provider if you can take showers.  When you cough or sneeze, hug a pillow. This helps with pain and decreases the chance of your incision opening up (dehiscing). Do this until your incision heals. Medicines  Take over-the-counter and prescription medicines only as told by your health care provider.  If you were prescribed an antibiotic medicine, take it as told by your health care provider. Do not stop taking the antibiotic even if you start to feel better.  Do not drive or use heavy machinery while taking prescription pain medicine. Lifestyle  Do not drink alcohol. This is especially important if you are breastfeeding or taking pain medicine.  Do not use any products that contain nicotine or tobacco, such as cigarettes, e-cigarettes, and chewing tobacco. If you need help quitting, ask your health care provider. Eating and drinking  Drink at least 8 eight-ounce glasses of water every day unless told not to by your health care provider. If you breastfeed, you may need to drink even more water.  Eat high-fiber foods every day. These foods may help prevent or relieve constipation. High-fiber foods include: ? Whole grain cereals and breads. ? Brown rice. ? Beans. ? Fresh fruits and vegetables. Activity   If possible, have someone help you care for your baby and help with household activities for at least a few days after you leave the hospital.  Return to your normal activities as told by your health care provider. Ask your health care provider what activities are safe for  you. °· Rest as much as possible. Try to rest or take a nap while your baby is sleeping. °· Do not lift anything that is heavier than 10 lbs (4.5 kg), or the limit that you were told, until your health care provider says that it is safe. °· Talk with your health care provider about when you can engage in sexual activity. This may depend on your: °? Risk of infection. °? How fast you heal. °? Comfort and desire to  engage in sexual activity. °General instructions °· Do not use tampons or douches until your health care provider approves. °· Wear loose, comfortable clothing and a supportive and well-fitting bra. °· Keep your perineum clean and dry. Wipe from front to back when you use the toilet. °· If you pass a blood clot, save it and call your health care provider to discuss. Do not flush blood clots down the toilet before you get instructions from your health care provider. °· Keep all follow-up visits for you and your baby as told by your health care provider. This is important. °Contact a health care provider if: °· You have: °? A fever. °? Bad-smelling vaginal discharge. °? Pus or a bad smell coming from your incision. °? Difficulty or pain when urinating. °? A sudden increase or decrease in the frequency of your bowel movements. °? More redness, swelling, or pain around your incision. °? More fluid or blood coming from your incision. °? A rash. °? Nausea. °? Little or no interest in activities you used to enjoy. °? Questions about caring for yourself or your baby. °· Your incision feels warm to the touch. °· Your breasts turn red or become painful or hard. °· You feel unusually sad or worried. °· You vomit. °· You pass a blood clot from your vagina. °· You urinate more than usual. °· You are dizzy or light-headed. °Get help right away if: °· You have: °? Pain that does not go away or get better with medicine. °? Chest pain. °? Difficulty breathing. °? Blurred vision or spots in your vision. °? Thoughts about hurting yourself or your baby. °? New pain in your abdomen or in one of your legs. °? A severe headache. °· You faint. °· You bleed from your vagina so much that you fill more than one sanitary pad in one hour. Bleeding should not be heavier than your heaviest period. °Summary °· After the procedure, it is common to have pain at your incision site, abdominal cramping, and slight bleeding from your vagina. °· Check  your incision area every day for signs of infection. °· Tell your health care provider about any unusual symptoms. °· Keep all follow-up visits for you and your baby as told by your health care provider. °This information is not intended to replace advice given to you by your health care provider. Make sure you discuss any questions you have with your health care provider. °Document Released: 05/20/2002 Document Revised: 03/06/2018 Document Reviewed: 03/06/2018 °Elsevier Interactive Patient Education © 2019 [employer] ° ° °Postpartum Baby Blues °The postpartum period begins right after the birth of a baby. During this time, there is often a lot of joy and excitement. It is also a time of many changes in the life of the parents. No matter how many times a mother gives birth, each child brings new challenges to the family, including different ways of relating to one another. °It is common to have feelings of excitement along with confusing changes in moods,   emotions, and thoughts. You may feel happy one minute and sad or stressed the next. These feelings of sadness usually happen in the period right after you have your baby, and they go away within a week or two. This is called the "baby blues." °What are the causes? °There is no known cause of baby blues. It is likely caused by a combination of factors. However, changes in hormone levels after childbirth are believed to trigger some of the symptoms. °Other factors that can play a role in these mood changes include: °· Lack of sleep. °· Stressful life events, such as poverty, caring for a loved one, or death of a loved one. °· Genetics. °What are the signs or symptoms? °Symptoms of this condition include: °· Brief changes in mood, such as going from extreme happiness to sadness. °· Decreased concentration. °· Difficulty sleeping. °· Crying spells and tearfulness. °· Loss of appetite. °· Irritability. °· Anxiety. °If the symptoms of baby blues last for more than 2  weeks or become more severe, you may have postpartum depression. °How is this diagnosed? °This condition is diagnosed based on an evaluation of your symptoms. There are no medical or lab tests that lead to a diagnosis, but there are various questionnaires that a health care provider may use to identify women with the baby blues or postpartum depression. °How is this treated? °Treatment is not needed for this condition. The baby blues usually go away on their own in 1-2 weeks. Social support is often all that is needed. You will be encouraged to get adequate sleep and rest. °Follow these instructions at home: °Lifestyle ° °  ° °· Get as much rest as you can. Take a nap when the baby sleeps. °· Exercise regularly as told by your health care provider. Some women find yoga and walking to be helpful. °· Eat a balanced and nourishing diet. This includes plenty of fruits and vegetables, whole grains, and lean proteins. °· Do little things that you enjoy. Have a cup of tea, take a bubble bath, read your favorite magazine, or listen to your favorite music. °· Avoid alcohol. °· Ask for help with household chores, cooking, grocery shopping, or running errands. Do not try to do everything yourself. Consider hiring a postpartum doula to help. This is a professional who specializes in providing support to new mothers. °· Try not to make any major life changes during pregnancy or right after giving birth. This can add stress. °General instructions °· Talk to people close to you about how you are feeling. Get support from your partner, family members, friends, or other new moms. You may want to join a support group. °· Find ways to cope with stress. This may include: °? Writing your thoughts and feelings in a journal. °? Spending time outside. °? Spending time with people who make you laugh. °· Try to stay positive in how you think. Think about the things you are grateful for. °· Take over-the-counter and prescription medicines  only as told by your health care provider. °· Let your health care provider know if you have any concerns. °· Keep all postpartum visits as told by your health care provider. This is important. °Contact a health care provider if: °· Your baby blues do not go away after 2 weeks. °Get help right away if: °· You have thoughts of taking your own life (suicidal thoughts). °· You think you may harm the baby or other people. °· You see or hear things that are   not there (hallucinations). Summary  After giving birth, you may feel happy one minute and sad or stressed the next. Feelings of sadness that happen right after the baby is born and go away after a week or two are called the "baby blues."  You can manage the baby blues by getting enough rest, eating a healthy diet, exercising, spending time with supportive people, and finding ways to cope with stress.  If feelings of sadness and stress last longer than 2 weeks or get in the way of caring for your baby, talk to your health care provider. This may mean you have postpartum depression. This information is not intended to replace advice given to you by your health care provider. Make sure you discuss any questions you have with your health care provider. Document Released: 06/01/2004 Document Revised: 10/24/2016 Document Reviewed: 10/24/2016 Elsevier Interactive Patient Education  2019 [employer]   Hematoma A hematoma is a collection of blood under the skin, in an organ, in a body space, in a joint space, or in other tissue. The blood can thicken (clot) to form a lump that you can see and feel. The lump is often firm and may become sore and tender. Most hematomas get better in a few days to weeks. However, some hematomas may be serious and require medical care. Hematomas can range from very small to very large. What are the causes? This condition is caused by:  A blunt or penetrating injury.  A leakage from a blood vessel under the skin.  Some  medical procedures, including surgeries, such as oral surgery, face lifts, and surgeries on the joints.  Some medical conditions that cause bleeding or bruising. There may be multiple hematomas that appear in different areas of the body. What increases the risk? You are more likely to develop this condition if:  You are an older adult.  You use blood thinners. What are the signs or symptoms?  Symptoms of this condition depend on where the hematoma is located.  Common symptoms of a hematoma that is under the skin include:  A firm lump on the body.  Pain and tenderness in the area.  Bruising. Blue, dark blue, purple-red, or yellowish skin (discoloration) may appear at the site of the hematoma if the hematoma is close to the surface of the skin. Common symptoms of a hematoma that is deep in the tissues or body spaces may be less obvious. They include:  A collection of blood in the stomach (intra-abdominal hematoma). This may cause pain in the abdomen, weakness, fainting, and shortness of breath.  A collection of blood in the head (intracranial hematoma). This may cause a headache or symptoms such as weakness, trouble speaking or understanding, or a change in consciousness. How is this diagnosed? This condition is diagnosed based on:  Your medical history.  A physical exam.  Imaging tests, such as an ultrasound or CT scan. These may be needed if your health care provider suspects a hematoma in deeper tissues or body spaces.  Blood tests. These may be needed if your health care provider believes that the hematoma is caused by a medical condition. How is this treated? Treatment for this condition depends on the cause, size, and location of the hematoma. Treatment may include:  Doing nothing. The majority of hematomas do not need treatment as many of them go away on their own over time.  Surgery or close monitoring. This may be needed for large hematomas or hematomas that affect  vital organs.  Medicines. Medicines may be given if there is an underlying medical cause for the hematoma. Follow these instructions at home: Managing pain, stiffness, and swelling   If directed, put ice on the affected area. ? Put ice in a plastic bag. ? Place a towel between your skin and the bag. ? Leave the ice on for 20 minutes, 2-3 times a day for the first couple of days.  If directed, apply heat to the affected area after applying ice for a couple of days. Use the heat source that your health care provider recommends, such as a moist heat pack or a heating pad. ? Place a towel between your skin and the heat source. ? Leave the heat on for 20-30 minutes. ? Remove the heat if your skin turns bright red. This is especially important if you are unable to feel pain, heat, or cold. You may have a greater risk of getting burned.  Raise (elevate) the affected area above the level of your heart while you are sitting or lying down.  If told, wrap the affected area with an elastic bandage. The bandage applies pressure (compression) to the area, which may help to reduce swelling and promote healing. Do not wrap the bandage too tightly around the affected area.  If your hematoma is on a leg or foot (lower extremity) and is painful, your health care provider may recommend crutches. Use them as told by your health care provider. General instructions  Take over-the-counter and prescription medicines only as told by your health care provider.  Keep all follow-up visits as told by your health care provider. This is important. Contact a health care provider if:  You have a fever.  The swelling or discoloration gets worse.  You develop more hematomas. Get help right away if:  Your pain is worse or your pain is not controlled with medicine.  Your skin over the hematoma breaks or starts bleeding.  Your hematoma is in your chest or abdomen and you have weakness, shortness of breath, or a  change in consciousness.  You have a hematoma on your scalp that is caused by a fall or injury, and you also have: ? A headache that gets worse. ? Trouble speaking or understanding speech. ? Weakness. ? Change in alertness or consciousness. Summary  A hematoma is a collection of blood under the skin, in an organ, in a body space, in a joint space, or in other tissue.  This condition usually does not need treatment because many hematomas go away on their own over time.  Large hematomas, or those that may affect vital organs, may need surgical drainage or monitoring. If the hematoma is caused by a medical condition, medicines may be prescribed.  Get help right away if your hematoma breaks or starts to bleed, you have shortness of breath, or you have a headache or trouble speaking after a fall. This information is not intended to replace advice given to you by your health care provider. Make sure you discuss any questions you have with your health care provider. Document Released: 04/11/2004 Document Revised: 01/31/2018 Document Reviewed: 01/31/2018 Elsevier Interactive Patient Education  2019 [employer].

## 2018-10-21 NOTE — Progress Notes (Signed)
CSW received consult for hx of marijuana use.  Referral was screened out due to the following: ~MOB had no documented substance use after initial prenatal visit/+UPT. ~MOB had no positive drug screens after initial prenatal visit/+UPT. ~Baby's UDS is negative (infant has no UDS collected)  Please consult CSW if current concerns arise or by MOB's request.  CSW will monitor CDS results and make report to Child Protective Services if warranted.  Selam Pietsch, LCSW Clinical Social Worker  [facility]  [phone]   

## 2018-10-21 NOTE — Discharge Summary (Signed)
OB Discharge Summary     Patient Name: Gloria Berg DOB: 1996/07/20 MRN: [id number]  Date of admission: 10/18/2018 Delivering MD: Silverio LayIVARD, SANDRA   Date of discharge: 10/22/2018  Admitting diagnosis: 40.6wks water broke Intrauterine pregnancy: 1342w6d     Secondary diagnosis:  Active Problems:   Normal labor   Cesarean delivery delivered     Discharge diagnosis: Term Pregnancy Delivered and Anemia                                                                                                Post partum procedures:blood transfusion  Augmentation: n/a  Complications: None  Hospital course:  Onset of Labor With Unplanned C/S  23 y.o. yo G1P1001 at 1542w6d was admitted in Active Labor on 10/18/2018. Patient had a labor course significant for arrest of descent. Dr. Mora ApplPinn attempted operative vaginal delivery with forceps which was not successful. Membrane Rupture Time/Date: 2:10 AM ,10/18/2018   The patient went for cesarean section due to Arrest of Descent, and delivered a Viable infant,10/18/2018  Details of operation can be found in separate operative note. Patient had an uncomplicated postpartum course.  She is ambulating,tolerating a regular diet, passing flatus, and urinating well.  Patient is discharged home in stable condition 10/22/18.  Physical exam  Vitals:   10/21/18 1357 10/21/18 1645 10/22/18 0036 10/22/18 0601  BP: 120/78 134/90 127/66 123/77  Pulse: 90 92 90 84  Resp: 18 18 18 16   Temp: (!) 97.4 F (36.3 C) 98 F (36.7 C) 97.9 F (36.6 C) 98 F (36.7 C)  TempSrc: Oral Oral Oral Oral  SpO2: 100%     Weight:      Height:       General: alert, cooperative and no distress, mood is stable without symptoms of depression Lochia: appropriate Uterine Fundus: firm Incision: Healing well with no significant drainage, No significant erythema, Dressing is clean, dry, and intact DVT Evaluation: No evidence of DVT seen on physical exam. Negative Homan's sign. No cords or calf  tenderness. No significant calf/ankle edema.  Labs: Lab Results  Component Value Date   WBC 13.8 (H) 10/21/2018   HGB 8.4 (L) 10/21/2018   HCT 26.1 (L) 10/21/2018   MCV 80.1 10/21/2018   PLT 354 10/21/2018   CMP Latest Ref Rng & Units 10/18/2018  Creatinine 0.44 - 1.00 mg/dL 4.090.70    Discharge instruction: per After Visit Summary and "Baby and Me Booklet". Hematoma precautions discussed and handout given.   After visit meds:  Allergies as of 10/22/2018   No Known Allergies     Medication List    STOP taking these medications   cyclobenzaprine 5 MG tablet Commonly known as:  FLEXERIL     TAKE these medications   ferrous sulfate 325 (65 FE) MG tablet Take 1 tablet (325 mg total) by mouth 2 (two) times daily with a meal.   ibuprofen 600 MG tablet Commonly known as:  ADVIL,MOTRIN Take 1 tablet (600 mg total) by mouth every 6 (six) hours as needed for moderate pain.   oxyCODONE 5 MG immediate release tablet Commonly known as:  Oxy IR/ROXICODONE Take 1 tablet (5 mg total) by mouth every 6 (six) hours as needed for up to 7 days for severe pain.       Diet: routine diet  Activity: Advance as tolerated. Pelvic rest for 6 weeks.   Outpatient follow up:1 week for incision check, repeat H&H, and early postpartum depression screening. 6 weeks for postpartum visit. Follow up Appt:No future appointments. Follow up Visit:No follow-ups on file.  Postpartum contraception: Progesterone only pills  Newborn Data: Live born female  Birth Weight: 8 lb 12.4 oz (3980 g) APGAR: 6, 7  Newborn Delivery   Birth date/time:  10/18/2018 17:31:00 Delivery type:  C-Section, Low Transverse Trial of labor:  Yes C-section categorization:  Primary     Baby Feeding: Bottle and Breast Disposition:home with mother   10/22/2018 Janeece Riggers, CNM

## 2018-10-21 NOTE — Progress Notes (Addendum)
Jenne PaneCheyenne N Kresse [phone] Postpartum Postoperative Day # [address]  Corine N HardwickSummey, G1P1001, 1519w6d, S/P Primary LT Cesarean Section due to failure to desced post complete and pushed for 3.30 hours.   Subjective:  Pt stable, resting in bed in NAD, Lab resulted HGB loss of 6.1 from 6.8 this morning, pt has baby in room and finished breastfeeding, pt endorses feeling gassy and fatigue due to breastfeeding all night and slight dizziness when she gets up fast, but no heart palpitations or CP, SOB, no extreme abdominal pain.   Objective: Patient Vitals for the past 24 hrs:  BP Temp Temp src Pulse Resp  10/21/18 0601 114/75 98.4 F (36.9 C) Oral (!) 103 16  10/20/18 2217 118/77 98 F (36.7 C) Oral (!) 108 17  10/20/18 1535 118/83 98.1 F (36.7 C) Oral (!) 108 18  10/20/18 0820 98/65 98.2 F (36.8 C) Oral (!) 103 18     Physical Exam:  General: alert, cooperative, appears stated age and no distress Mood/Affect: Happy, fatigued  Lungs: clear to auscultation, no wheezes, rales or rhonchi, symmetric air entry.  Heart: normal rate, regular rhythm, normal S1, S2, no murmurs, rubs, clicks or gallops. Breast: breasts appear normal, no suspicious masses, no skin or nipple changes or axillary nodes. Abdomen:  + bowel sounds, soft, non-tender Incision: healing well, no significant drainage, no dehiscence, Honeycomb dressing CDI, mil tender to palpate Uterine Fundus: firm, involution -2 Lochia: appropriate Skin: Warm, Dry. Swelling noted at vaginal perineum, no hematomas noted on external vulva, or perineum.  DVT Evaluation: No evidence of DVT seen on physical exam. Negative Homan's sign. No cords or calf tenderness. No significant calf/ankle edema.  Labs: Recent Labs    10/19/18 1159 10/20/18 1206 10/21/18 0537  HGB 6.9* 6.8* 6.1*  HCT 21.7* 21.3* 19.4*  WBC 19.8* 16.8* 12.1*    CBG (last 3)  No results for input(s): GLUCAP in the last 72 hours.   I/O: No intake/output data recorded.    Assessment Postpartum Postoperative Day # 3. MetLifeCheyenne N Mineer, G1P1001, 6619w6d, S/P Primary CS LT Cesarean Section due to failure of fetal decent post completion and pushing 3.30 hours, failed forcepts.  Pt stable. I rechecked on pt to assess her willing ness to have a blood transfusion and how she is feeling. Pt still feeling tired, but stated she feels good with newborn in room now. Pt Bp now is 114/75 with HR of 103, CBC resulted hgb of  6.1 which is a drop from 6.8 yesterday, pt s/p transfused x1 IV iron infusion on the 02/08. I consulted with Dr Richardson Doppole whom agree to transfused 2 units RBC now, pt verbalized consent for blood transfusion and R&B were reviewed, pt stable/  No evidence of internal bleeding, honey comb CDI, trapped gas in abdomen, but feels better with passing gas. Dr Estanislado Pandyivard was given report as well as Kathalene FramesEllis Greer CNM with Dr Richardson Doppole on phone and Dr Estanislado Pandyivard is to assess pt today.   Plan: Continue other mgmt as ordered Anemia: Blood transfusion x2 unit now, with benadryl and tylenol and 4 hour CBC post transfusion.  Tachycardia: Continue to monitor, hydrate, pending CBC, possible second IV outpt transfusion if no resolved and cause from anemia.  VTE Prophylactics: SCD, ambulated as tolerates.  S/P Suspected chorio: Monitor for fever.  Pain control: Motrin/Tylenol/Narcotics PRN Baby Female: out pt circ, rooming in now.  Education given regarding options for contraception, including barrier methods, injectable contraception, IUD placement, oral contraceptives.  Plan for discharge tomorrow,  Breastfeeding, Lactation consult and Contraception undecided  Dr. Estanislado Pandy aware of POC and pt status.   [facility] NP-C, CNM 10/21/2018, 7:09 AM   I agree with the above assessment by [facility] CNM. I assessed the patient this morning. Patient is not distressed. She reports mild discomfort in her vagina due to the repair but denies any severe pain, unilateral swelling, or feelings of rectal  fullness. Mild vulvar swelling noted on exam, swelling is bilateral and vulva is nontender. She reports a moderate headache and fatigue but denies dizziness. She is mildly tender to RUQ and has intermittent mild epigastric gas pain. There is no palpable mass. Patient reports not sleeping last night because of caring for her baby. Patient to receive 2 units PRBCs and CBC will be assessed 4 hrs after second unit transfused. Dr. Estanislado Pandy to assess patient prior to discharge.   Janeece Riggers 10/21/18 9:39 AM   Patient seen and assessed Patient reports pain is well managed but still bothered by left incisional pain/burning Ambulating, tolerating normal diet, voiding and passing gas Abdomen: normal BS, incision appropriately tender bot otherwise no guarding/rebound Ext: 2+ pitting edema. No evidence of DVT S/p completed transfusion of 2nd unit of [facility] Baby is doing well but needs to remain inpatient for phototherapy CBC in 4 hours Will keep inpatient for D/c in am No evidence of ongoing intra-abdominal bleeding Will plan office follow-up in 1 week at the time of outpatient circumcision

## 2018-10-21 NOTE — Lactation Note (Signed)
This note was copied from a baby's chart. Lactation Consultation Note  Patient Name: Gloria Berg [id number] Date: 10/21/2018   Baby Gloria Tritz now 26 hours old. Mom receiving blood currently.  Baby bilirubin has risen and not sure if he will be going under light therapy today. Mom reports she has not felt like pumping today.  Mom reports she did try and breastfeed today once and she felt he did well. Urged mom to call lactation as needed.   Maternal Data    Feeding    LATCH Score                   Interventions    Lactation Tools Discussed/Used     Consult Status      Bulah Lurie Michaelle Copas 10/21/2018, 3:24 PM

## 2018-10-22 LAB — TYPE AND SCREEN
ABO/RH(D): A POS
ANTIBODY SCREEN: NEGATIVE
Unit division: 0
Unit division: 0

## 2018-10-22 LAB — BPAM RBC
Blood Product Expiration Date: 202002242359
Blood Product Expiration Date: 202002242359
ISSUE DATE / TIME: 202002101107
ISSUE DATE / TIME: 202002101330
Unit Type and Rh: 6200
Unit Type and Rh: 6200

## 2018-10-22 MED ORDER — IBUPROFEN 600 MG PO TABS: 600.0000 mg | ORAL_TABLET | Freq: Four times a day (QID) | ORAL | 0 refills | Status: DC | PRN

## 2018-10-22 MED ORDER — FERROUS SULFATE 325 (65 FE) MG PO TABS: 325.0000 mg | ORAL_TABLET | Freq: Two times a day (BID) | ORAL | 2 refills | Status: DC

## 2018-10-22 MED ORDER — OXYCODONE HCL 5 MG PO TABS: 5.0000 mg | ORAL_TABLET | Freq: Four times a day (QID) | ORAL | 0 refills | Status: AC | PRN

## 2018-10-22 NOTE — Lactation Note (Signed)
This note was copied from a baby's chart. Lactation Consultation Note  Patient Name: Gloria Berg [id number] Date: 10/22/2018 Reason for consult: Initial assessment;1st time breastfeeding;Primapara;Infant weight loss  36 hours old FT female who is now being mostly formula fed by his mother, she's a P1. Mom voiced baby is latching on well but when checking on flowsheets noticed that she hasn't put baby to the breast since yesterday, all the feedings have been bottles, baby is on [employer]. Baby is at 3% weight loss and no longer on phototherapy.  Reviewed engorgement prevention and treatment, red flags on when to call baby's pediatrician as well as prevention and treatment for sore nipples. Parents aware of LC OP services and will contact PRN.  Maternal Data    Feeding Feeding Type: Formula Nipple Type: Slow - flow   Interventions Interventions: Breast feeding basics reviewed  Lactation Tools Discussed/Used     Consult Status Consult Status: Complete Follow-up type: Call as needed    Haruko Mersch S Philis Nettle 10/22/2018, 11:27 AM

## 2021-01-29 ENCOUNTER — Inpatient Hospital Stay (HOSPITAL_COMMUNITY)
Admission: AD | Admit: 2021-01-29 | Discharge: 2021-01-29 | Disposition: A | Discharge status: Home / Self Care | Payer: Medicaid Other | Attending: Obstetrics and Gynecology | Admitting: Obstetrics and Gynecology

## 2021-01-29 ENCOUNTER — Other Ambulatory Visit: Payer: Self-pay

## 2021-01-29 ENCOUNTER — Encounter (HOSPITAL_COMMUNITY): Payer: Self-pay | Admitting: Obstetrics and Gynecology

## 2021-01-29 ENCOUNTER — Inpatient Hospital Stay (HOSPITAL_COMMUNITY): Payer: Medicaid Other

## 2021-01-29 DIAGNOSIS — Z6790 Unspecified blood type, Rh positive: Secondary | ICD-10-CM | POA: Diagnosis not present

## 2021-01-29 DIAGNOSIS — N939 Abnormal uterine and vaginal bleeding, unspecified: Secondary | ICD-10-CM | POA: Diagnosis not present

## 2021-01-29 DIAGNOSIS — O3680X0 Pregnancy with inconclusive fetal viability, not applicable or unspecified: Secondary | ICD-10-CM | POA: Insufficient documentation

## 2021-01-29 DIAGNOSIS — O26851 Spotting complicating pregnancy, first trimester: Secondary | ICD-10-CM | POA: Diagnosis not present

## 2021-01-29 DIAGNOSIS — Z79899 Other long term (current) drug therapy: Secondary | ICD-10-CM | POA: Diagnosis not present

## 2021-01-29 DIAGNOSIS — O209 Hemorrhage in early pregnancy, unspecified: Secondary | ICD-10-CM | POA: Diagnosis present

## 2021-01-29 DIAGNOSIS — Z3A01 Less than 8 weeks gestation of pregnancy: Secondary | ICD-10-CM | POA: Diagnosis not present

## 2021-01-29 DIAGNOSIS — Z791 Long term (current) use of non-steroidal anti-inflammatories (NSAID): Secondary | ICD-10-CM | POA: Diagnosis not present

## 2021-01-29 DIAGNOSIS — Z87891 Personal history of nicotine dependence: Secondary | ICD-10-CM | POA: Diagnosis not present

## 2021-01-29 HISTORY — DX: Headache, unspecified: R51.9

## 2021-01-29 HISTORY — DX: Gastro-esophageal reflux disease without esophagitis: K21.9

## 2021-01-29 HISTORY — DX: Myoneural disorder, unspecified: G70.9

## 2021-01-29 LAB — URINALYSIS, ROUTINE W REFLEX MICROSCOPIC
Bilirubin Urine: NEGATIVE
Glucose, UA: NEGATIVE mg/dL
Hgb urine dipstick: NEGATIVE
Ketones, ur: NEGATIVE mg/dL
Leukocytes,Ua: NEGATIVE
Nitrite: NEGATIVE
Protein, ur: NEGATIVE mg/dL
Specific Gravity, Urine: 1.023 (ref 1.005–1.030)
pH: 6 (ref 5.0–8.0)

## 2021-01-29 LAB — COMPREHENSIVE METABOLIC PANEL
ALT: 14 U/L (ref 0–44)
AST: 13 U/L — ABNORMAL LOW (ref 15–41)
Albumin: 3.8 g/dL (ref 3.5–5.0)
Alkaline Phosphatase: 78 U/L (ref 38–126)
Anion gap: 6 (ref 5–15)
BUN: 6 mg/dL (ref 6–20)
CO2: 24 mmol/L (ref 22–32)
Calcium: 9.1 mg/dL (ref 8.9–10.3)
Chloride: 108 mmol/L (ref 98–111)
Creatinine, Ser: 0.55 mg/dL (ref 0.44–1.00)
GFR, Estimated: >60 mL/min (ref >60)
Glucose, Bld: 95 mg/dL (ref 70–99)
Potassium: 3.6 mmol/L (ref 3.5–5.1)
Sodium: 138 mmol/L (ref 135–145)
Total Bilirubin: 0.6 mg/dL (ref 0.3–1.2)
Total Protein: 6.9 g/dL (ref 6.5–8.1)

## 2021-01-29 LAB — WET PREP, GENITAL
Clue Cells Wet Prep HPF POC: NONE SEEN
Sperm: NONE SEEN
Trich, Wet Prep: NONE SEEN
Yeast Wet Prep HPF POC: NONE SEEN

## 2021-01-29 LAB — CBC
HCT: 38.1 % (ref 36.0–46.0)
Hemoglobin: 12.8 g/dL (ref 12.0–15.0)
MCH: 29.2 pg (ref 26.0–34.0)
MCHC: 33.6 g/dL (ref 30.0–36.0)
MCV: 86.8 fL (ref 80.0–100.0)
Platelets: 310 10*3/uL (ref 150–400)
RBC: 4.39 MIL/uL (ref 3.87–5.11)
RDW: 12.9 % (ref 11.5–15.5)
WBC: 10.9 10*3/uL — ABNORMAL HIGH (ref 4.0–10.5)
nRBC: 0 % (ref 0.0–0.2)

## 2021-01-29 LAB — POCT PREGNANCY, URINE: Preg Test, Ur: POSITIVE — AB

## 2021-01-29 LAB — HCG, QUANTITATIVE, PREGNANCY: hCG, Beta Chain, Quant, S: 776 m[IU]/mL — ABNORMAL HIGH (ref <5)

## 2021-01-29 NOTE — MAU Note (Signed)
Presents with c/o spotting and abdominal cramping.  Reports spotting began on Thursday, was heavier on Friday, but not spotting today.  LMP 12/27/2020.  +HPT.

## 2021-01-29 NOTE — MAU Provider Note (Addendum)
History     CSN: [id number]  Arrival date and time: 01/29/21 1708   Event Date/Time   First Provider Initiated Contact with Patient 01/29/21 1816      Chief Complaint  Patient presents with  . Cramping  . Spotting   Ms. Gloria Berg is a 25 y.o. G2P1001 at [redacted]w[redacted]d who presents to MAU for vaginal bleeding which began yesterday and is not present today. Patient describes the bleeding as spotting. Patient also endorses menstrual-like cramping that is not present at this time, but is intermittent and was present earlier today.   OB History    Gravida  2   Para  1   Term  1   Preterm      AB      Living  1     SAB      IAB      Ectopic      Multiple  0   Live Births  1           Past Medical History:  Diagnosis Date  . Asthma    child hood-no medications now  . Depression   . GERD (gastroesophageal reflux disease)   . Headache   . 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 Urinary tract infection     Past Surgical History:  Procedure Laterality Date  . CESAREAN SECTION N/A 10/18/2018   Procedure: CESAREAN SECTION;  Surgeon: Silverio Lay, MD;  Location: [facility] BIRTHING SUITES;  Service: Obstetrics;  Laterality: N/A;    Family History  Problem Relation Age of Onset  . Drug abuse Mother   . Anxiety disorder Father        takes medication for  . Drug abuse Maternal Grandfather        died of overdose  . Drug abuse Maternal Aunt   . Drug abuse Maternal Uncle     Social History   Tobacco Use  . Smoking status: Former Smoker    Types: Cigarettes    Quit date: 01/26/2021  . Smokeless tobacco: Never Used  Substance Use Topics  . Alcohol use: No    Comment: 1x June 2013  . Drug use: Yes    Types: Marijuana    Comment: last used 06/25/18    Allergies: No Known Allergies  Medications Prior to Admission  Medication Sig Dispense Refill Last Dose  . ferrous sulfate 325 (65 FE) MG tablet Take 1 tablet (325 mg total) by  mouth 2 (two) times daily with a meal. 60 tablet 2   . ibuprofen (ADVIL,MOTRIN) 600 MG tablet Take 1 tablet (600 mg total) by mouth every 6 (six) hours as needed for moderate pain. 30 tablet 0     Review of Systems  Constitutional: Negative for chills, diaphoresis, fatigue and fever.  Eyes: Negative for visual disturbance.  Respiratory: Negative for shortness of breath.   Cardiovascular: Negative for chest pain.  Gastrointestinal: Negative for abdominal pain, constipation, diarrhea, nausea and vomiting.  Genitourinary: Positive for pelvic pain and vaginal bleeding. Negative for dysuria, flank pain, frequency, urgency and vaginal discharge.  Neurological: Negative for dizziness, weakness, light-headedness and headaches.   Physical Exam   Blood pressure 119/67, pulse 94, temperature 98.3 F (36.8 C), temperature source Oral, resp. rate 19, height 5\' 3"  (1.6 m), weight 82.4 kg, last menstrual period 12/27/2020, SpO2 99 %, unknown if currently breastfeeding.  Patient Vitals for the past 24 hrs:  BP Temp Temp src Pulse Resp SpO2 Height Weight  01/29/21 1726 119/67 98.3 F (36.8 C) Oral 94 19 99 % -- --  01/29/21 1721 -- -- -- -- -- -- 5\' 3"  (1.6 m) 82.4 kg   Physical Exam Vitals and nursing note reviewed.  Constitutional:      General: She is not in acute distress.    Appearance: Normal appearance. She is not ill-appearing, toxic-appearing or diaphoretic.  HENT:     Head: Normocephalic and atraumatic.  Pulmonary:     Effort: Pulmonary effort is normal.  Neurological:     Mental Status: She is alert and oriented to person, place, and time.  Psychiatric:        Mood and Affect: Mood normal.        Behavior: Behavior normal.        Thought Content: Thought content normal.        Judgment: Judgment normal.    Results for orders placed or performed during the hospital encounter of 01/29/21 (from the past 24 hour(s))  Urinalysis, Routine w reflex microscopic Urine, Clean Catch      Status: None   Collection Time: 01/29/21  5:37 PM  Result Value Ref Range   Color, Urine YELLOW YELLOW   APPearance CLEAR CLEAR   Specific Gravity, Urine 1.023 1.005 - 1.030   pH 6.0 5.0 - 8.0   Glucose, UA NEGATIVE NEGATIVE mg/dL   Hgb urine dipstick NEGATIVE NEGATIVE   Bilirubin Urine NEGATIVE NEGATIVE   Ketones, ur NEGATIVE NEGATIVE mg/dL   Protein, ur NEGATIVE NEGATIVE mg/dL   Nitrite NEGATIVE NEGATIVE   Leukocytes,Ua NEGATIVE NEGATIVE  Pregnancy, urine POC     Status: Abnormal   Collection Time: 01/29/21  5:42 PM  Result Value Ref Range   Preg Test, Ur POSITIVE (A) NEGATIVE  Wet prep, genital     Status: Abnormal   Collection Time: 01/29/21  6:11 PM   Specimen: Vaginal  Result Value Ref Range   Yeast Wet Prep HPF POC NONE SEEN NONE SEEN   Trich, Wet Prep NONE SEEN NONE SEEN   Clue Cells Wet Prep HPF POC NONE SEEN NONE SEEN   WBC, Wet Prep HPF POC MODERATE (A) NONE SEEN   Sperm NONE SEEN   CBC     Status: Abnormal   Collection Time: 01/29/21  6:16 PM  Result Value Ref Range   WBC 10.9 (H) 4.0 - 10.5 K/uL   RBC 4.39 3.87 - 5.11 MIL/uL   Hemoglobin 12.8 12.0 - 15.0 g/dL   HCT 16.138.1 09.636.0 - 04.546.0 %   MCV 86.8 80.0 - 100.0 fL   MCH 29.2 26.0 - 34.0 pg   MCHC 33.6 30.0 - 36.0 g/dL   RDW 40.912.9 81.111.5 - 91.415.5 %   Platelets 310 150 - 400 K/uL   nRBC 0.0 0.0 - 0.2 %  Comprehensive metabolic panel     Status: Abnormal   Collection Time: 01/29/21  6:16 PM  Result Value Ref Range   Sodium 138 135 - 145 mmol/L   Potassium 3.6 3.5 - 5.1 mmol/L   Chloride 108 98 - 111 mmol/L   CO2 24 22 - 32 mmol/L   Glucose, Bld 95 70 - 99 mg/dL   BUN 6 6 - 20 mg/dL   Creatinine, Ser 7.820.55 0.44 - 1.00 mg/dL   Calcium 9.1 8.9 - 95.610.3 mg/dL   Total Protein 6.9 6.5 - 8.1 g/dL   Albumin 3.8 3.5 - 5.0 g/dL   AST 13 (L) 15 - 41 U/L   ALT 14 0 -  44 U/L   Alkaline Phosphatase 78 38 - 126 U/L   Total Bilirubin 0.6 0.3 - 1.2 mg/dL   GFR, Estimated >18 >84 mL/min   Anion gap 6 5 - 15  hCG, quantitative,  pregnancy     Status: Abnormal   Collection Time: 01/29/21  6:16 PM  Result Value Ref Range   hCG, Beta Chain, Quant, S 776 (H) <5 mIU/mL   US OB LESS THAN 14 WEEKS WITH OB TRANSVAGINAL  Result Date: 01/29/2021 CLINICAL DATA:  Vaginal spotting during 1st trimester pregnancy. EXAM: OBSTETRIC <14 WK [place] AND TRANSVAGINAL OB US TECHNIQUE: Both transabdominal and transvaginal ultrasound examinations were performed for complete evaluation of the gestation as well as the maternal uterus, adnexal regions, and pelvic cul-de-sac. Transvaginal technique was performed to assess early pregnancy. COMPARISON:  None. FINDINGS: Intrauterine gestational sac: Single Yolk sac:  Not Visualized. Embryo:  Not Visualized. MSD: 5 mm   5 w   1 d Subchorionic hemorrhage:  None visualized. Maternal uterus/adnexae: Both ovaries are normal in appearance. No mass or abnormal free fluid identified. IMPRESSION: Single intrauterine gestational sac measuring 5 weeks 1 day by mean sac diameter. Suggest correlation with serial b-hCG levels, and consider followup ultrasound to assess viability in 10-14 days. No evidence of adnexal mass or free fluid. Electronically Signed   By: Danae Orleans M.D.   On: 01/29/2021 19:02   MAU Course  Procedures  MDM -r/o ectopic -UA: WNL -CBC: WNL -CMP: no abnormalities requiring treatment -[place]: PUL -hCG: 776 -ABO: A Positive -WetPrep: WNL -GC/CT collected -pt discharged to home in stable condition  Orders Placed This Encounter  Procedures  . Wet prep, genital    Standing Status:   Standing    Number of Occurrences:   1  . US OB LESS THAN 14 WEEKS WITH OB TRANSVAGINAL    Standing Status:   Standing    Number of Occurrences:   1    Order Specific Question:   Symptom/Reason for Exam    Answer:   Vaginal spotting [[id number]]  . Urinalysis, Routine w reflex microscopic Urine, Clean Catch    Standing Status:   Standing    Number of Occurrences:   1  . CBC    Standing Status:   Standing     Number of Occurrences:   1  . Comprehensive metabolic panel    Standing Status:   Standing    Number of Occurrences:   1  . hCG, quantitative, pregnancy    Standing Status:   Standing    Number of Occurrences:   1  . Pregnancy, urine POC    Standing Status:   Standing    Number of Occurrences:   1  . Discharge patient    Order Specific Question:   Discharge disposition    Answer:   01-Home or Self Care [1]    Order Specific Question:   Discharge patient date    Answer:   01/29/2021   No orders of the defined types were placed in this encounter.  Assessment and Plan   1. Pregnancy of unknown anatomic location   2. Vaginal spotting   3. Blood type, Rh positive   4. [redacted] weeks gestation of pregnancy    Allergies as of 01/29/2021   No Known Allergies     Medication List    STOP taking these medications   ibuprofen 600 MG tablet Commonly known as: ADVIL     TAKE these medications   ferrous sulfate 325 (  65 FE) MG tablet Take 1 tablet (325 mg total) by mouth 2 (two) times daily with a meal.      -will call with culture results, if positive -safe meds in pregnancy list given -discussed ectopic vs. SAB vs. First-trimester -discussed with client the diagnosis of pregnancy of unknown anatomic location.  Three possibilities of outcome are: a healthy pregnancy that is too early to see a yolk sac to confirm the pregnancy is in the uterus, a pregnancy that is not healthy and has not developed and will not develop, and an ectopic pregnancy that is in the abdomen that cannot be identified at this time.  And ectopic pregnancy can be a life threatening situation as a pregnancy needs to be in the uterus which is a muscle and can stretch to accommodate the growth of a pregnancy.  Other structures in the pelvis and abdomen as not muscular and do not stretch with the growth of a pregnancy.  Worst case scenario is that a structure ruptures with a growing pregnancy not in the uterus and and internal  hemorrhage can be a life threatening situation.  We need to follow the progression of this pregnancy carefully.  We need to check another serum pregnancy hormone level to determine if the levels are rising appropriately  and to determine the next steps that are needed for you. Patient's questions were answered. -strict ectopic precautions given -return MAU precautions -f/u on 02/01/2021 at [facility] for repeat hCG, appt scheduled -pt discharged to home in stable condition  Joni Reining E Livvy Spilman 01/29/2021, 7:51 PM

## 2021-01-29 NOTE — Discharge Instructions (Signed)
Ectopic Pregnancy  An ectopic pregnancy happens when a fertilized egg attaches (implants) outside the uterus. In a normal pregnancy, a fertilized egg implants in the uterus. An ectopic pregnancy cannot develop into a healthy baby. Most ectopic pregnancies occur in one of the fallopian tubes, which is where an egg travels from an ovary to get to the uterus. This is called a tubal pregnancy. An ectopic pregnancy can also happen on an ovary, on the cervix, or in the abdomen. When a fertilized egg implants on tissue outside the uterus and begins to grow, it may cause the tissue to tear or burst. This is known as a ruptured ectopic pregnancy. The tear or burst causes internal bleeding. This may cause intense pain in the abdomen. An ectopic pregnancy is a medical emergency and can be life-threatening. What are the causes? The most common cause of this condition is damage to one of the fallopian tubes. A fallopian tube may be narrowed or blocked, and that stops the fertilized egg from reaching the uterus. Sometimes, the cause of this condition is not known. What increases the risk? The following factors may make you more likely to develop this condition:  Having gone through infertility treatment before.  Having had an ectopic pregnancy before.  Having had surgery to have the fallopian tubes tied.  Becoming pregnant while using an intrauterine device for birth control.  Taking birth control pills before the age of 16. Other risk factors include:  Smoking.  Alcohol use.  History of DES exposure. DES is a medicine that was used until 1971 and affected babies whose mothers took the medicine. What are the signs or symptoms? Common symptoms of this condition include:  Missing a menstrual period.  Nausea or tiredness.  Tender breasts.  Other normal pregnancy symptoms. Other symptoms may include:  Pain during sex.  Vaginal bleeding or spotting.  Cramping or pain in the lower abdomen.  A  fast heartbeat, low blood pressure, and sweating.  Pain or increased pressure while having a bowel movement. Symptoms of a ruptured ectopic pregnancy and internal bleeding may include:  Sudden, severe pain in the abdomen.  Dizziness, weakness, feeling light-headed, or fainting.  Pain in the shoulder or neck area. How is this diagnosed? This condition is diagnosed by:  A blood test to check for the pregnancy hormone.  A pelvic exam to find painful areas or a mass in the abdomen.  Ultrasound. A probe is inserted into the vagina to see if there is a pregnancy in or outside the uterus.  Taking a sample of tissue from the uterus.  Surgery to look closely at the fallopian tubes through an incision in the abdomen. How is this treated? This condition is usually treated with medicine or surgery. Sometimes, ectopic pregnancies can resolve on their own, under close monitoring by your health care provider. Medicine A medicine called methotrexate may be given to cause the pregnancy tissue to be absorbed. The medicine may be given if:  The diagnosis is made early, with no signs of active bleeding.  The fallopian tube has not torn or burst. You will need blood tests to make sure the medicine is working. It may take 4-6 weeks for the pregnancy tissues to be absorbed. Surgery Surgery may be performed to:  Remove the pregnancy tissue.  Stop internal bleeding.  Remove part or all of the fallopian tube.  Remove the uterus. This is rare. After surgery, you may need to have blood tests to make sure the surgery worked.   Follow these instructions at home: Medicines  Take over-the-counter and prescription medicines only as told by your health care provider.  Ask your health care provider if the medicine prescribed to you: ? Requires you to avoid driving or using machinery. ? Can cause constipation. You may need to take these actions to prevent or treat constipation:  Drink enough fluid to  keep your urine pale yellow.  Take over-the-counter or prescription medicines.  Eat foods that are high in fiber, such as beans, whole grains, and fresh fruits and vegetables.  Limit foods that are high in fat and processed sugars, such as fried or sweet foods. General instructions  Rest or limit your activity, if told by your health care provider.  Do not have sex or put anything in your vagina, such as tampons or douches, for 6 weeks or until your health care provider says it is safe.  Do not lift anything that is heavier than 10 lb (4.5 kg), or the limit that you are told, until your health care provider says that it is safe.  Return to your normal activities as told by your health care provider. Ask your health care provider what activities are safe for you.  Keep all follow-up visits. This is important. Contact a health care provider if:  You have a fever or chills.  You have nausea and vomiting. Get help right away if:  Your pain gets worse or is not relieved by medicine.  You feel dizzy or weak.  You feel light-headed or you faint.  You have sudden, severe pain in your abdomen.  You have sudden pain in the shoulder or neck area. Summary  An ectopic pregnancy happens when a fertilized egg implants outside the uterus. Most ectopic pregnancies occur in one of the fallopian tubes.  An ectopic pregnancy is a medical emergency and can be life-threatening.  The most common cause of this condition is damage to one of the fallopian tubes.  This condition is usually treated with medicine or surgery. Some ectopic pregnancies resolve on their own, under close monitoring by your health care provider. This information is not intended to replace advice given to you by your health care provider. Make sure you discuss any questions you have with your health care provider. Document Revised: 12/09/2019 Document Reviewed: 12/09/2019 Elsevier Patient Education  2021 [place].        Ruptured Ectopic Pregnancy  An ectopic pregnancy happens when a fertilized egg attaches (implants) outside the uterus, usually in one of the fallopian tubes. This is where an egg travels from an ovary to get to the uterus. An ectopic pregnancy cannot develop into a healthy baby. When a fertilized egg implants on tissue outside the uterus and begins to grow, it may cause the tissue to tear or burst. This is known as a ruptured ectopic pregnancy. The tear or burst causes internal bleeding. This may cause intense pain in the abdomen. A ruptured ectopic pregnancy can affect the ability to have children (fertility), depending on damage it causes to the reproductive organs. A ruptured ectopic pregnancy is a medical emergency. If not treated right away, it can lead to blood loss or shock, and it can be life-threatening. What are the causes? An ectopic pregnancy ruptures because it is growing in a spot that is not meant to expand and support the growth of a pregnancy. What increases the risk? You are more likely to have a ruptured ectopic pregnancy if:  You have an ectopic pregnancy, but  you do not have any symptoms and the pregnancy is not found early enough to treat it before it ruptures.  You have nonsurgical treatment of an ectopic pregnancy.  You choose not to have any treatment for an ectopic pregnancy. What are the signs or symptoms? Symptoms of a ruptured ectopic pregnancy and internal bleeding may include:  Sudden, severe pain in the abdomen.  Feeling dizzy, weak, or light-headed.  Fainting.  Pain in the shoulder or neck area. How is this diagnosed? This condition is diagnosed based on your medical history, symptoms, a physical exam, and testing. Testing may include an ultrasound and blood tests. How is this treated? This condition is treated with IV fluids and emergency surgery to remove the ectopic pregnancy and repair the area where the rupture occurred. If a lot  of blood was lost, donated blood may be needed (blood transfusion). You may receive a Rho (D) immune globulin shot if you are Rh negative and your baby's father is Rh positive, or if the Rh type of the father is unknown. This shot is given to prevent Rh problems in future pregnancies. You may receive other medicines. Summary  An ectopic pregnancy happens when a fertilized egg attaches (implants) outside the uterus, usually in one of the fallopian tubes. When a fertilized egg implants on tissue outside the uterus and begins to grow, it may cause the tissue to tear or burst. This is known as a ruptured ectopic pregnancy.  A ruptured ectopic pregnancy is a medical emergency. If not treated right away, it can lead to blood loss or shock, and it can be life-threatening.  This condition is treated with IV fluids and emergency surgery to remove the ectopic pregnancy and repair the area where the rupture occurred. If a lot of blood was lost, donated blood may be needed. This information is not intended to replace advice given to you by your health care provider. Make sure you discuss any questions you have with your health care provider. Document Revised: 12/09/2019 Document Reviewed: 12/09/2019 Elsevier Patient Education  2021 [place].        Miscarriage A miscarriage is the loss of pregnancy before the 20th week. Most miscarriages happen during the first 3 months of pregnancy. Sometimes, a miscarriage can happen before a woman knows that she is pregnant. Having a miscarriage can be an emotional experience. If you have had a miscarriage, talk with your health care provider about any questions you may have about the loss of your baby, the grieving process, and your plans for future pregnancy. What are the causes? Many times, the cause of a miscarriage is not known. What increases the risk? The following factors may make a pregnant woman more likely to have a miscarriage: Certain medical  conditions  Conditions that affect the hormone balance in the body, such as thyroid disease or polycystic ovary syndrome.  Diabetes.  Autoimmune disorders.  Infections.  Bleeding disorders.  Obesity. Lifestyle factors  Using products with tobacco or nicotine in them or being exposed to tobacco smoke.  Having alcohol.  Having large amounts of caffeine.  Recreational drug use. Problems with reproductive organs or structures  Cervical insufficiency. This is when the lowest part of the uterus (cervix) opens and thins before pregnancy is at term.  Having a condition called Asherman syndrome. This syndrome causes scarring in the uterus or causes the uterus to be abnormal in structure.  Fibrous growths, called fibroids, in the uterus.  Congenital abnormalities. These problems are present at  birth.  Infection of the cervix or uterus. Personal or medical history  Injury (trauma).  Having had a miscarriage before.  Being younger than age 54 or older than age 46.  Exposure to harmful substances in the environment. This may include radiation or heavy metals, such as lead.  Use of certain medicines. What are the signs or symptoms? Symptoms of this condition include:  Vaginal bleeding or spotting, with or without cramps or pain.  Pain or cramping in the abdomen or lower back.  Fluid or tissue coming out of the vagina. How is this diagnosed? This condition may be diagnosed based on:  A physical exam.  Ultrasound.  Lab tests, such as blood tests, urine tests, or swabs for infection. How is this treated? Treatment for a miscarriage is sometimes not needed if all the pregnancy tissue that was in the uterus comes out on its own, and there are no other problems such as infection or heavy bleeding. In other cases, this condition may be treated with:  Dilation and curettage (D&C). In this procedure, the cervix is stretched open and any remaining pregnancy tissue is removed  from the lining of the uterus (endometrium).  Medicines. These may include: ? Antibiotic medicine, to treat infection. ? Medicine to help any remaining pregnancy tissue come out of the body. ? Medicine to reduce (contract) the size of the uterus. These medicines may be given if there is a lot of bleeding. If you have Rh-negative blood, you may be given an injection of a medicine called Rho(D) immune globulin. This medicine helps prevent problems with future pregnancies. Follow these instructions at home: Medicines  Take over-the-counter and prescription medicines only as told by your health care provider.  If you were prescribed antibiotic medicine, take it as told by your health care provider. Do not stop taking the antibiotic even if you start to feel better. Activity  Rest as told by your health care provider. Ask your health care provider what activities are safe for you.  Have someone help with home and family responsibilities during this time. General instructions  Monitor how much tissue or blood clot material comes out of the vagina.  Do not have sex, douche, or put anything, such as tampons, in your vagina until your health care provider says it is okay.  To help you and your partner with the grieving process, talk with your health care provider or get counseling.  When you are ready, meet with your health care provider to discuss any important steps you should take for your health. Also, discuss steps you should take to have a healthy pregnancy in the future.  Keep all follow-up visits. This is important.   Where to find more information  [employer]ians [employer]necologists: acog.org  [place] Department of Health and [employer], systems of Women's Health: EverydayCosmetics.no Contact a health care provider if:  You have a fever or chills.  There is bad-smelling fluid coming from the vagina.  You have more bleeding instead of  less.  Tissue or blood clots come out of your vagina. Get help right away if:  You have severe cramps or pain in your back or abdomen.  Heavy bleeding soaks through 2 large sanitary pads an hour for more than 2 hours.  You become light-headed or weak.  You faint.  You feel sad, and your sadness takes over your thoughts.  You think about hurting yourself. If you ever feel like you may hurt yourself or others,  or have thoughts about taking your own life, get help right away. Go to your nearest emergency department or:  Call your local emergency services (911 in the [place]  Call a suicide crisis helpline, such as the National Suicide Prevention Lifeline at 364-252-0145. This is open 24 hours a day in the [place]  Text the Crisis Text Line at (772)282-2961 (in the [place] Summary  Most miscarriages happen in the first 3 months of pregnancy. Sometimes miscarriage happens before a woman knows that she is pregnant.  Follow instructions from your health care provider about medicines and activity.  To help you and your partner with grieving, talk with your health care provider or get counseling.  Keep all follow-up visits. This information is not intended to replace advice given to you by your health care provider. Make sure you discuss any questions you have with your health care provider. Document Revised: 02/27/2020 Document Reviewed: 02/27/2020 Elsevier Patient Education  2021 Elsevier Inc.        Obstetrics: Normal and Problem Pregnancies (7th ed., pp. 102-121). [place], [place]: Elsevier."> Textbook of Family Medicine (9th ed., pp. [id number]). [place], [place]: Elsevier [employer].">  First Trimester of Pregnancy  The first trimester of pregnancy starts on the first day of your last menstrual period until the end of week 12. This is months 1 through 3 of pregnancy. A week after a sperm fertilizes an egg, the egg will implant into the wall of the uterus and begin to develop into a baby.  By the end of 12 weeks, all the baby's organs will be formed and the baby will be 2-3 inches in size. Body changes during your first trimester Your body goes through many changes during pregnancy. The changes vary and generally return to normal after your baby is born. Physical changes  You may gain or lose weight.  Your breasts may begin to grow larger and become tender. The tissue that surrounds your nipples (areola) may become darker.  Dark spots or blotches (chloasma or mask of pregnancy) may develop on your face.  You may have changes in your hair. These can include thickening or thinning of your hair or changes in texture. Health changes  You may feel nauseous, and you may vomit.  You may have heartburn.  You may develop headaches.  You may develop constipation.  Your gums may bleed and may be sensitive to brushing and flossing. Other changes  You may tire easily.  You may urinate more often.  Your menstrual periods will stop.  You may have a loss of appetite.  You may develop cravings for certain kinds of food.  You may have changes in your emotions from day to day.  You may have more vivid and strange dreams. Follow these instructions at home: Medicines  Follow your health care provider's instructions regarding medicine use. Specific medicines may be either safe or unsafe to take during pregnancy. Do not take any medicines unless told to by your health care provider.  Take a prenatal vitamin that contains at least 600 micrograms (mcg) of folic acid. Eating and drinking  Eat a healthy diet that includes fresh fruits and vegetables, whole grains, good sources of protein such as meat, eggs, or tofu, and low-fat dairy products.  Avoid raw meat and unpasteurized juice, milk, and cheese. These carry germs that can harm you and your baby.  If you feel nauseous or you vomit: ? Eat 4 or 5 small meals a day instead of 3 large meals. ? Try eating  a few soda  crackers. ? Drink liquids between meals instead of during meals.  You may need to take these actions to prevent or treat constipation: ? Drink enough fluid to keep your urine pale yellow. ? Eat foods that are high in fiber, such as beans, whole grains, and fresh fruits and vegetables. ? Limit foods that are high in fat and processed sugars, such as fried or sweet foods. Activity  Exercise only as directed by your health care provider. Most people can continue their usual exercise routine during pregnancy. Try to exercise for 30 minutes at least 5 days a week.  Stop exercising if you develop pain or cramping in the lower abdomen or lower back.  Avoid exercising if it is very hot or humid or if you are at high altitude.  Avoid heavy lifting.  If you choose to, you may have sex unless your health care provider tells you not to. Relieving pain and discomfort  Wear a good support bra to relieve breast tenderness.  Rest with your legs elevated if you have leg cramps or low back pain.  If you develop bulging veins (varicose veins) in your legs: ? Wear support hose as told by your health care provider. ? Elevate your feet for 15 minutes, 3-4 times a day. ? Limit salt in your diet. Safety  Wear your seat belt at all times when driving or riding in a car.  Talk with your health care provider if someone is verbally or physically abusive to you.  Talk with your health care provider if you are feeling sad or have thoughts of hurting yourself. Lifestyle  Do not use hot tubs, steam rooms, or saunas.  Do not douche. Do not use tampons or scented sanitary pads.  Do not use herbal remedies, alcohol, illegal drugs, or medicines that are not approved by your health care provider. Chemicals in these products can harm your baby.  Do not use any products that contain nicotine or tobacco, such as cigarettes, e-cigarettes, and chewing tobacco. If you need help quitting, ask your health care  provider.  Avoid cat litter boxes and soil used by cats. These carry germs that can cause birth defects in the baby and possibly loss of the unborn baby (fetus) by miscarriage or stillbirth. General instructions  During routine prenatal visits in the first trimester, your health care provider will do a physical exam, perform necessary tests, and ask you how things are going. Keep all follow-up visits. This is important.  Ask for help if you have counseling or nutritional needs during pregnancy. Your health care provider can offer advice or refer you to specialists for help with various needs.  Schedule a dentist appointment. At home, brush your teeth with a soft toothbrush. Floss gently.  Write down your questions. Take them to your prenatal visits. Where to find more information  American Pregnancy Association: americanpregnancy.org  [employer] of [employer]ians and Gynecologists: https://www.todd-brady.net/  Office on [employer] Health: MightyReward.co.nz Contact a health care provider if you have:  Dizziness.  A fever.  Mild pelvic cramps, pelvic pressure, or nagging pain in the abdominal area.  Nausea, vomiting, or diarrhea that lasts for 24 hours or longer.  A bad-smelling vaginal discharge.  Pain when you urinate.  Known exposure to a contagious illness, such as chickenpox, measles, Zika virus, HIV, or hepatitis. Get help right away if you have:  Spotting or bleeding from your vagina.  Severe abdominal cramping or pain.  Shortness of breath or chest pain.  Any kind of trauma, such as from a fall or a car crash.  New or increased pain, swelling, or redness in an arm or leg. Summary  The first trimester of pregnancy starts on the first day of your last menstrual period until the end of week 12 (months 1 through 3).  Eating 4 or 5 small meals a day rather than 3 large meals may help to relieve nausea and vomiting.  Do not use any products that  contain nicotine or tobacco, such as cigarettes, e-cigarettes, and chewing tobacco. If you need help quitting, ask your health care provider.  Keep all follow-up visits. This is important. This information is not intended to replace advice given to you by your health care provider. Make sure you discuss any questions you have with your health care provider. Document Revised: 02/04/2020 Document Reviewed: 12/11/2019 Elsevier Patient Education  2021 Elsevier Inc.                        Safe Medications in Pregnancy    Acne: Benzoyl Peroxide Salicylic Acid  Backache/Headache: Tylenol: 2 regular strength every 4 hours OR              2 Extra strength every 6 hours  Colds/Coughs/Allergies: Benadryl (alcohol free) 25 mg every 6 hours as needed Breath right strips Claritin Cepacol throat lozenges Chloraseptic throat spray Cold-Eeze- up to three times per day Cough drops, alcohol free Flonase (by prescription only) Guaifenesin Mucinex Robitussin DM (plain only, alcohol free) Saline nasal spray/drops Sudafed (pseudoephedrine) & Actifed ** use only after [redacted] weeks gestation and if you do not have high blood pressure Tylenol Vicks Vaporub Zinc lozenges Zyrtec   Constipation: Colace Ducolax suppositories Fleet enema Glycerin suppositories Metamucil Milk of magnesia Miralax Senokot Smooth move tea  Diarrhea: Kaopectate Imodium A-D  *NO pepto Bismol  Hemorrhoids: Anusol Anusol HC Preparation H Tucks  Indigestion: Tums Maalox Mylanta Zantac  Pepcid  Insomnia: Benadryl (alcohol free) 25mg  every 6 hours as needed Tylenol PM Unisom, no Gelcaps  Leg Cramps: Tums MagGel  Nausea/Vomiting:  Bonine Dramamine Emetrol Ginger extract Sea bands Meclizine  Nausea medication to take during pregnancy:  Unisom (doxylamine succinate 25 mg tablets) Take one tablet daily at bedtime. If symptoms are not adequately controlled, the dose can be increased to a  maximum recommended dose of two tablets daily (1/2 tablet in the morning, 1/2 tablet mid-afternoon and one at bedtime). Vitamin B6 100mg  tablets. Take one tablet twice a day (up to 200 mg per day).  Skin Rashes: Aveeno products Benadryl cream or 25mg  every 6 hours as needed Calamine Lotion 1% cortisone cream  Yeast infection: Gyne-lotrimin 7 Monistat 7   **If taking multiple medications, please check labels to avoid duplicating the same active ingredients **take medication as directed on the label ** Do not exceed 4000 mg of tylenol in 24 hours **Do not take medications that contain aspirin or ibuprofen

## 2021-01-31 LAB — GC/CHLAMYDIA PROBE AMP (~~LOC~~) NOT AT ARMC
Chlamydia: NEGATIVE
Comment: NEGATIVE
Comment: NORMAL
Neisseria Gonorrhea: NEGATIVE

## 2021-02-01 ENCOUNTER — Ambulatory Visit (INDEPENDENT_AMBULATORY_CARE_PROVIDER_SITE_OTHER): Payer: Medicaid Other

## 2021-02-01 ENCOUNTER — Other Ambulatory Visit: Payer: Self-pay

## 2021-02-01 VITALS — BP 114/80 | HR 88 | Wt 181.2 lb

## 2021-02-01 DIAGNOSIS — O3680X0 Pregnancy with inconclusive fetal viability, not applicable or unspecified: Secondary | ICD-10-CM

## 2021-02-01 LAB — BETA HCG QUANT (REF LAB): hCG Quant: 1297 m[IU]/mL

## 2021-02-01 NOTE — Progress Notes (Signed)
Chart reviewed for nurse visit. Agree with plan of care.   Venora Maples, MD 02/01/21 1:32 PM

## 2021-02-01 NOTE — Progress Notes (Signed)
Pt here today for STAT Beta Lab s/p pregnancy of unknown location.  Pt states that she is here today because the ultrasound did not show everything.  Pt reports that she is having mild pelvic pain on both sides and no vaginal bleeding.  Pt presents with positive depression screening however declines BHC.  Pt advised that I will call her around noon time to discuss results and f/u.   Pt verbalized understanding.   Received beta results from [facility] of 1297.  Reviewed results with Dr. Crissie Reese who states appropriate rise and recommends f/u U/S around the end of next week.  U/S schedule for June 2nd @ 0800.  Pt informed provider's recommendation and U/S appt.  Pt advised that if she starts bleeding like a period or begins to have intense pain to please go to MAU for evaluation.  Pt verbalized understanding with no further questions.   Addison Naegeli, RN  02/01/21

## 2021-02-10 ENCOUNTER — Ambulatory Visit (INDEPENDENT_AMBULATORY_CARE_PROVIDER_SITE_OTHER): Payer: Medicaid Other

## 2021-02-10 ENCOUNTER — Other Ambulatory Visit: Payer: Self-pay

## 2021-02-10 ENCOUNTER — Ambulatory Visit
Admission: RE | Admit: 2021-02-10 | Discharge: 2021-02-10 | Disposition: A | Discharge status: Home / Self Care | Payer: Medicaid Other | Source: Ambulatory Visit | Attending: Family Medicine | Admitting: Family Medicine

## 2021-02-10 VITALS — Wt 182.0 lb

## 2021-02-10 DIAGNOSIS — Z3A01 Less than 8 weeks gestation of pregnancy: Secondary | ICD-10-CM | POA: Diagnosis not present

## 2021-02-10 DIAGNOSIS — O3680X0 Pregnancy with inconclusive fetal viability, not applicable or unspecified: Secondary | ICD-10-CM

## 2021-02-10 DIAGNOSIS — O368390 Maternal care for abnormalities of the fetal heart rate or rhythm, unspecified trimester, not applicable or unspecified: Secondary | ICD-10-CM | POA: Diagnosis not present

## 2021-02-10 NOTE — Progress Notes (Signed)
Reviewed results with Dr. Shawnie Pons who agreed that due to fetal bradycardia that pt should have a f/u U/S in 10 days to make sure she has a good pregnancy.  U/S scheduled for June 13 @ 1530.  Notified pt providers recommendation and U/S appt.  Pt verbalized understanding with on further questions.   Leonette Nutting  02/10/21

## 2021-02-10 NOTE — Progress Notes (Signed)
Here today for results following viability [place]. Results reviewed with Magnus Sinning, PA who states this shows a viable IUP at 6w with a subchorionic hemorrhage and fetal bradycardia. Recommends pt return for OB Transvaginal US in 2 weeks to reassess bradycardia. Recommends pt avoid anything in the vagina or physical exertion at this time. Pt and partner brought to nurse visit room. Results, dating, and provider recommendation reviewed. Bleeding/return precautions reviewed. Pt agreeable to plan. Hand off to [place], RN for remainder of visit today.   Fleet Contras RN 02/10/21

## 2021-02-11 NOTE — Progress Notes (Signed)
Patient seen and assessed by nursing staff.  Agree with documentation and plan.  

## 2021-02-21 ENCOUNTER — Other Ambulatory Visit: Payer: Self-pay

## 2021-02-21 ENCOUNTER — Ambulatory Visit
Admission: RE | Admit: 2021-02-21 | Discharge: 2021-02-21 | Disposition: A | Discharge status: Home / Self Care | Payer: Medicaid Other | Source: Ambulatory Visit | Attending: Family Medicine | Admitting: Family Medicine

## 2021-02-21 ENCOUNTER — Ambulatory Visit (INDEPENDENT_AMBULATORY_CARE_PROVIDER_SITE_OTHER): Payer: Medicaid Other | Admitting: General Practice

## 2021-02-21 DIAGNOSIS — O368390 Maternal care for abnormalities of the fetal heart rate or rhythm, unspecified trimester, not applicable or unspecified: Secondary | ICD-10-CM | POA: Insufficient documentation

## 2021-02-21 DIAGNOSIS — O3680X0 Pregnancy with inconclusive fetal viability, not applicable or unspecified: Secondary | ICD-10-CM | POA: Diagnosis present

## 2021-02-21 DIAGNOSIS — O219 Vomiting of pregnancy, unspecified: Secondary | ICD-10-CM

## 2021-02-21 DIAGNOSIS — Z712 Person consulting for explanation of examination or test findings: Secondary | ICD-10-CM

## 2021-02-21 MED ORDER — PROMETHAZINE HCL 25 MG PO TABS: 25.0000 mg | ORAL_TABLET | Freq: Four times a day (QID) | ORAL | 1 refills | Status: DC | PRN

## 2021-02-22 NOTE — Progress Notes (Signed)
Attestation of Attending Supervision of clinical support staff: I agree with the care provided to this patient and was available for any consultation.  I have reviewed the RN's note and chart. I was available for consult and to see the patient if needed.   Jahnasia Tatum MD MPH Attending Physician Faculty Practice- [facility]  

## 2021-02-22 NOTE — Progress Notes (Signed)
Patient presents to office today for ultrasound results. Reviewed results with Dr Alvester Morin who finds single IUP with reassuring FHR- patient should begin OB care. Informed patient of results & provided pictures. Patient verbalized understanding and reports significant nausea lately- requests Rx. Rx sent in for phenergan per protocol. Patient plans to start care with [facility].   Chase Caller RN BSN 02/22/21

## 2021-03-29 ENCOUNTER — Other Ambulatory Visit: Payer: Self-pay

## 2021-03-29 ENCOUNTER — Ambulatory Visit (INDEPENDENT_AMBULATORY_CARE_PROVIDER_SITE_OTHER): Payer: Medicaid Other | Admitting: *Deleted

## 2021-03-29 VITALS — BP 113/78 | HR 86 | Temp 97.7°F | Ht 63.0 in | Wt 182.0 lb

## 2021-03-29 DIAGNOSIS — O219 Vomiting of pregnancy, unspecified: Secondary | ICD-10-CM

## 2021-03-29 DIAGNOSIS — Z3480 Encounter for supervision of other normal pregnancy, unspecified trimester: Secondary | ICD-10-CM

## 2021-03-29 DIAGNOSIS — F1290 Cannabis use, unspecified, uncomplicated: Secondary | ICD-10-CM | POA: Insufficient documentation

## 2021-03-29 MED ORDER — GOJJI WEIGHT SCALE MISC: 1.0000 | Freq: Every day | 0 refills | Status: DC | PRN

## 2021-03-29 MED ORDER — PRENATAL 27-1 MG PO TABS: 1.0000 | ORAL_TABLET | Freq: Every day | ORAL | 12 refills | Status: AC

## 2021-03-29 MED ORDER — BLOOD PRESSURE MONITOR AUTOMAT DEVI: 1.0000 | Freq: Every day | 0 refills | Status: DC

## 2021-03-29 MED ORDER — DICLEGIS 10-10 MG PO TBEC: 2.0000 | DELAYED_RELEASE_TABLET | Freq: Every evening | ORAL | 2 refills | Status: DC | PRN

## 2021-03-29 NOTE — Progress Notes (Signed)
   Location: [facility] Patient: clinic Provider: clinic  PRENATAL INTAKE SUMMARY  Ms. Junio presents today New OB Nurse Interview.  OB History     Gravida  2   Para  1   Term  1   Preterm      AB      Living  1      SAB      IAB      Ectopic      Multiple  0   Live Births  1          I have reviewed the patient's medical, obstetrical, social, and family histories, medications, and available lab results.  SUBJECTIVE She complains of nausea with vomiting. Using marijuana to help relieve symptoms.  History of c-section. Patient desires a repeat c-section.  Marijuana last used a 1-2 weeks ago. Per patient, it helps with nausea and vomiting.   OBJECTIVE Initial Nurse interview for history/labs (New OB)  EDD: 10/03/2021 by LMP GA: 13w1 G2P1001 FHT: 152  GENERAL APPEARANCE: alert, well appearing, in no apparent distress, oriented to person, place and time   ASSESSMENT Normal pregnancy Patient with history of depression. PHQ-9 score 14 and GAD-7 score 15 Lab tech called nurse to lab area, patient was vomiting and flushed. Patient reported she did not eat this morning. This is a new problem. Advised to eat before next appointment. Patient observed for 10-15 minutes before leaving clinic.  BP laying down: 113/79, hr 76 116/78; hr 82 Sitting: 113/78, Hr 86  PLAN Prenatal care:  [facility] for BP monitor and weight scale Rx for Diclegis 2 tabs PO at bedtime PRN Rx PNV 1 tab daily PO  Labs to be completed at next visit due vomiting and flushed. Opt to repeat labs. Appt with Behavioral Health Specialist due to high score on PHQ-9.  Follow Up Instructions:   I discussed the assessment and treatment plan with the patient. The patient was provided an opportunity to ask questions and all were answered. The patient agreed with the plan and demonstrated an understanding of the instructions.   The patient was advised to call back  or seek an in-person evaluation if the symptoms worsen or if the condition fails to improve as anticipated.  I provided 40 minutes of  face-to-face time during this encounter.  Clovis Pu, RN

## 2021-03-29 NOTE — Patient Instructions (Signed)
   Genetic Screening Results Information: You are having genetic testing called [facility] today.  It will take approximately 2 weeks before the results are available.  To get your results, you need Internet access to a web browser to search [place]/MyChart (the direct app on your phone will not give you these results).  Then select Lab Scanned and click on the blue hyper link that says View Image to see your [facility] results.  You can also use the directions on the purple card given to look up your results directly on the [facility] website.  

## 2021-03-31 LAB — URINE CULTURE, OB REFLEX

## 2021-03-31 LAB — CULTURE, OB URINE

## 2021-04-06 ENCOUNTER — Telehealth: Payer: Self-pay | Admitting: Licensed Clinical Social Worker

## 2021-04-06 ENCOUNTER — Encounter: Payer: Medicaid Other | Admitting: Licensed Clinical Social Worker

## 2021-04-06 NOTE — Telephone Encounter (Signed)
Called pt regarding scheduled mychart visit. Left message for callback  

## 2021-04-15 ENCOUNTER — Other Ambulatory Visit: Payer: Self-pay | Admitting: Certified Nurse Midwife

## 2021-04-15 ENCOUNTER — Other Ambulatory Visit (HOSPITAL_COMMUNITY)
Admission: AD | Admit: 2021-04-15 | Discharge: 2021-04-15 | Disposition: A | Discharge status: Home / Self Care | Payer: Medicaid Other | Attending: Certified Nurse Midwife | Admitting: Certified Nurse Midwife

## 2021-04-15 ENCOUNTER — Other Ambulatory Visit (HOSPITAL_COMMUNITY)
Admission: RE | Admit: 2021-04-15 | Discharge: 2021-04-15 | Disposition: A | Discharge status: Home / Self Care | Payer: Medicaid Other | Source: Ambulatory Visit | Attending: Certified Nurse Midwife | Admitting: Certified Nurse Midwife

## 2021-04-15 ENCOUNTER — Encounter: Payer: Self-pay | Admitting: Certified Nurse Midwife

## 2021-04-15 ENCOUNTER — Other Ambulatory Visit: Payer: Self-pay

## 2021-04-15 ENCOUNTER — Ambulatory Visit (INDEPENDENT_AMBULATORY_CARE_PROVIDER_SITE_OTHER): Payer: Medicaid Other | Admitting: Certified Nurse Midwife

## 2021-04-15 VITALS — BP 117/78 | HR 93 | Temp 98.2°F | Wt 183.2 lb

## 2021-04-15 DIAGNOSIS — Z124 Encounter for screening for malignant neoplasm of cervix: Secondary | ICD-10-CM

## 2021-04-15 DIAGNOSIS — Z3492 Encounter for supervision of normal pregnancy, unspecified, second trimester: Secondary | ICD-10-CM

## 2021-04-15 DIAGNOSIS — Z3A15 15 weeks gestation of pregnancy: Secondary | ICD-10-CM | POA: Insufficient documentation

## 2021-04-15 DIAGNOSIS — O0932 Supervision of pregnancy with insufficient antenatal care, second trimester: Secondary | ICD-10-CM | POA: Diagnosis present

## 2021-04-15 DIAGNOSIS — Z98891 History of uterine scar from previous surgery: Secondary | ICD-10-CM

## 2021-04-15 DIAGNOSIS — O219 Vomiting of pregnancy, unspecified: Secondary | ICD-10-CM

## 2021-04-15 DIAGNOSIS — N949 Unspecified condition associated with female genital organs and menstrual cycle: Secondary | ICD-10-CM

## 2021-04-15 LAB — CBC
HCT: 35.7 % — ABNORMAL LOW (ref 36.0–46.0)
Hemoglobin: 11.8 g/dL — ABNORMAL LOW (ref 12.0–15.0)
MCH: 28.7 pg (ref 26.0–34.0)
MCHC: 33.1 g/dL (ref 30.0–36.0)
MCV: 86.9 fL (ref 80.0–100.0)
Platelets: 298 10*3/uL (ref 150–400)
RBC: 4.11 MIL/uL (ref 3.87–5.11)
RDW: 13.1 % (ref 11.5–15.5)
WBC: 8.9 10*3/uL (ref 4.0–10.5)
nRBC: 0 % (ref 0.0–0.2)

## 2021-04-15 LAB — OB RESULTS CONSOLE GC/CHLAMYDIA: Gonorrhea: NEGATIVE

## 2021-04-15 LAB — HEPATITIS C ANTIBODY: HCV Ab: NONREACTIVE

## 2021-04-15 LAB — HEPATITIS B SURFACE ANTIGEN: Hepatitis B Surface Ag: NONREACTIVE

## 2021-04-15 LAB — HIV ANTIBODY (ROUTINE TESTING W REFLEX): HIV Screen 4th Generation wRfx: NONREACTIVE

## 2021-04-15 LAB — ABO/RH: ABO/RH(D): A POS

## 2021-04-15 LAB — ANTIBODY SCREEN: Antibody Screen: NEGATIVE

## 2021-04-15 MED ORDER — ONDANSETRON 4 MG PO TBDP
4.0000 mg | ORAL_TABLET | Freq: Three times a day (TID) | ORAL | 2 refills | Status: DC | PRN
Start: 2021-04-15 — End: 2021-09-28

## 2021-04-15 NOTE — Progress Notes (Signed)
History:   Gloria Berg is a 25 y.o. G2P1001 at [redacted]w[redacted]d by LMP being seen today for her first obstetrical visit.  Her obstetrical history is significant for  previous Cesarean for fetal intolerance of labor but no other risk factors . Patient strongly desires a repeat Cesarean Patient does intend to breast feed. Pregnancy history fully reviewed.  Patient reports nausea, vomiting, and sudden, sharp pains in lower abdomen when sneezing or moving abruptly .   Attempted blood draw at intake appointment but patient had a vagal response and the bloodwork was not repeated. Offered to let her lay down for this attempt, but patient reported this is not a regular occurrence (she has several tattoos), it was entirely circumstantial. She hadn't been able to eat, was light-headed to begin with and then asked the lab tech to use a specific area. Lab tech tried three other locations (all were unsuccessful and patient had several bruises), finally one in the wrist. When that was unsuccessful, lab tech continued "digging around even though I asked her to stop and then everything went black." She strongly prefers not to have blood drawn by the same technician.   HISTORY: OB History  Gravida Para Term Preterm AB Living  2 1 1  0 0 1  SAB IAB Ectopic Multiple Live Births  0 0 0 0 1    # Outcome Date GA Lbr Len/2nd Weight Sex Delivery Anes PTL Lv  2 Gravida           1 Term 10/18/18 [redacted]w[redacted]d 09:41 / 04:51 8 lb 12.4 oz (3.98 kg) M CS-LTranv EPI  LIV     Birth Comments: caput/moulding     Name: Stipe,BOY Vonette     Apgar1: 6  Apgar5: 7    Has never had a pap smear, will collect today.  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   Urinary tract infection    Past Surgical History:  Procedure  Laterality Date   CESAREAN SECTION N/A 10/18/2018   Procedure: CESAREAN SECTION;  Surgeon: 12/17/2018, MD;  Location: [facility];  Service: Obstetrics;  Laterality: N/A;   Family History  Problem Relation Age of Onset   Drug abuse Mother    Anxiety disorder Father        takes medication for   Drug abuse Maternal Grandfather        died of overdose   Drug abuse Maternal Aunt    Drug abuse Maternal Uncle    Social History   Tobacco Use   Smoking status: Former    Types: Cigarettes    Quit date: 01/26/2021    Years since quitting: 0.2    Passive exposure: Never   Smokeless tobacco: Never  Vaping Use   Vaping Use: Never used  Substance Use Topics   Alcohol use: No    Comment: 1x June 2013   Drug use: Yes    Types: Marijuana    Comment: Last use a couple of weeks ago   No Known Allergies Current Outpatient Medications on File Prior to Visit  Medication Sig Dispense Refill   Blood Pressure Monitoring (BLOOD PRESSURE MONITOR AUTOMAT) DEVI 1 Device by Does not apply route daily. Automatic blood pressure cuff regular size. To monitor blood  pressure regularly at home. ICD-10 code:Z34.90 1 each 0   DICLEGIS 10-10 MG TBEC Take 2 tablets by mouth at bedtime as needed. 60 tablet 2   Misc. Devices (GOJJI WEIGHT SCALE) MISC 1 Device by Does not apply route daily as needed. To weight self daily as needed at home. ICD-10 code: Z34.90 1 each 0   Prenatal 27-1 MG TABS Take 1 tablet by mouth daily at 6 (six) AM. 30 tablet 12   promethazine (PHENERGAN) 25 MG tablet Take 1 tablet (25 mg total) by mouth every 6 (six) hours as needed for nausea or vomiting. (Patient not taking: Reported on 03/29/2021) 30 tablet 1   No current facility-administered medications on file prior to visit.    Review of Systems Pertinent items noted in HPI and remainder of comprehensive ROS otherwise negative. Physical Exam:   Vitals:   04/15/21 0900  BP: 117/78  Pulse: 93  Temp: 98.2 F (36.8 C)   Weight: 183 lb 3.2 oz (83.1 kg)   Fetal Heart Rate (bpm): 145  Constitutional: Well-developed, well-nourished pregnant female in no acute distress.  HEENT: PERRLA Skin: normal color and turgor, no rash Cardiovascular: normal rate & rhythm, no murmur Respiratory: normal effort, lung sounds clear throughout GI: Abd soft, non-tender, pos BS x 4, gravid appropriate for gestational age MS: Extremities nontender, no edema, normal ROM Neurologic: Alert and oriented x 4.  GU: no CVA tenderness Pelvic: NEFG, physiologic discharge, no blood, cervix clean. Pap/swabs collected FHR:   Assessment:    Pregnancy: G2P1001 Patient Active Problem List   Diagnosis Date Noted   History of cesarean section 04/15/2021   Supervision of other normal pregnancy, antepartum 03/29/2021   Marijuana use 03/29/2021   Nausea and vomiting of pregnancy, antepartum 03/29/2021     Plan:    1. Initial obstetric visit in second trimester Initial labs drawn at [facility] Lab - ABO/Rh; Future - Antibody screen; Future - CBC; Future - Hepatitis B surface antigen; Future - HIV (Save tube for possible reflex); Future - RPR; Future - Rubella screen; Future - Hepatitis C antibody; Future - HIV Antibody (routine testing w rflx); Future Continue prenatal vitamins. Problem list reviewed and updated. Genetic Screening discussed, First trimester screen, Quad screen, and NIPS: ordered. Ultrasound discussed; fetal anatomic survey: ordered. Anticipatory guidance about prenatal visits given including labs, ultrasounds, and testing. Discussed usage of Babyscripts and virtual visits as additional source of managing and completing prenatal visits in midst of coronavirus and pandemic.   Encouraged to complete MyChart Registration for her ability to review results, send requests, and have questions addressed.  The nature of [facility] with multiple MDs and Advanced Practice  Providers was explained to patient; also emphasized that residents, students are part of our team.  2. [redacted] weeks gestation of pregnancy - Needs AFP/genetic screening tests ordered. Will check to see if she can have future testing completed at [facility] if she still prefers not to have our lab tech draw.  3. Pap smear for cervical cancer screening - Cytology - PAP( [facility])  4. History of Cesarean section - Advised she will have an appointment with one of our surgeons to discuss and consent for a repeat Cesarean  5. Nausea and vomiting of pregnancy - Requests zofran as that has worked for her before, will send script to pharmacy.  6. Round ligament pains - Demonstrated massages to relieve round ligament pains   Routine obstetric precautions reviewed. Encouraged to seek out  care at office or emergency room [facility] preferred) for urgent and/or emergent concerns.  Return in about 4 weeks (around 05/13/2021) for IN-PERSON, LOB.    Edd Arbour, MSN, CNM, IBCLC Certified Nurse Midwife, [facility]

## 2021-04-15 NOTE — Progress Notes (Signed)
Pt declined lab testing in the office due to difficult stick and copious bruising at last attempt that caused her to pass out. Sent to outpatient lab at [facility] for new OB panel.  Edd Arbour, CNM, MSN, IBCLC Certified Nurse Midwife, [facility]

## 2021-04-16 LAB — RPR: RPR Ser Ql: NONREACTIVE

## 2021-04-17 LAB — RUBELLA SCREEN: Rubella: 1.93 index (ref >0.99)

## 2021-04-18 ENCOUNTER — Other Ambulatory Visit: Payer: Medicaid Other

## 2021-04-19 LAB — CYTOLOGY - PAP
Adequacy: ABSENT
Chlamydia: NEGATIVE
Comment: NEGATIVE
Comment: NEGATIVE
Comment: NORMAL
Neisseria Gonorrhea: NEGATIVE
Trichomonas: NEGATIVE

## 2021-04-20 ENCOUNTER — Telehealth: Payer: Self-pay | Admitting: Licensed Clinical Social Worker

## 2021-04-20 ENCOUNTER — Encounter: Payer: Medicaid Other | Admitting: Licensed Clinical Social Worker

## 2021-04-20 NOTE — Telephone Encounter (Signed)
Called pt regarding scheduled mychart visit. Left message for callback  

## 2021-04-28 ENCOUNTER — Encounter: Payer: Medicaid Other | Admitting: Licensed Clinical Social Worker

## 2021-04-28 ENCOUNTER — Telehealth: Payer: Self-pay | Admitting: Licensed Clinical Social Worker

## 2021-04-28 NOTE — Telephone Encounter (Signed)
Called pt regarding scheduled visit. Left message for callback  

## 2021-05-12 ENCOUNTER — Encounter: Payer: Self-pay | Admitting: *Deleted

## 2021-05-13 ENCOUNTER — Encounter: Payer: Medicaid Other | Admitting: Certified Nurse Midwife

## 2021-05-17 ENCOUNTER — Other Ambulatory Visit: Payer: Self-pay

## 2021-05-17 ENCOUNTER — Other Ambulatory Visit: Payer: Self-pay | Admitting: Certified Nurse Midwife

## 2021-05-17 ENCOUNTER — Ambulatory Visit: Payer: Medicaid Other | Attending: Certified Nurse Midwife

## 2021-05-17 DIAGNOSIS — Z3480 Encounter for supervision of other normal pregnancy, unspecified trimester: Secondary | ICD-10-CM

## 2021-05-18 ENCOUNTER — Telehealth: Payer: Medicaid Other | Admitting: Certified Nurse Midwife

## 2021-05-18 ENCOUNTER — Ambulatory Visit (INDEPENDENT_AMBULATORY_CARE_PROVIDER_SITE_OTHER): Payer: Medicaid Other | Admitting: Certified Nurse Midwife

## 2021-05-18 VITALS — BP 112/74 | HR 108 | Temp 98.0°F | Wt 187.2 lb

## 2021-05-18 DIAGNOSIS — Z3A20 20 weeks gestation of pregnancy: Secondary | ICD-10-CM

## 2021-05-18 DIAGNOSIS — Z3492 Encounter for supervision of normal pregnancy, unspecified, second trimester: Secondary | ICD-10-CM

## 2021-05-22 NOTE — Progress Notes (Signed)
   PRENATAL VISIT NOTE  Subjective:  Gloria Berg is a 25 y.o. G3P1001 at [redacted]w[redacted]d being seen today for ongoing prenatal care.  She is currently monitored for the following issues for this low-risk pregnancy and has Supervision of other normal pregnancy, antepartum; Marijuana use; Nausea and vomiting of pregnancy, antepartum; and History of cesarean section on their problem list.  Patient reports no complaints.  Contractions: Not present. Vag. Bleeding: None.  Movement: Present. Denies leaking of fluid.   The following portions of the patient's history were reviewed and updated as appropriate: allergies, current medications, past family history, past medical history, past social history, past surgical history and problem list.   Objective:   Vitals:   05/18/21 1101  BP: 112/74  Pulse: (!) 108  Temp: 98 F (36.7 C)  Weight: 187 lb 3.2 oz (84.9 kg)    Fetal Status: Fetal Heart Rate (bpm): 143   Movement: Present     General:  Alert, oriented and cooperative. Patient is in no acute distress.  Skin: Skin is warm and dry. No rash noted.   Cardiovascular: Normal heart rate noted  Respiratory: Normal respiratory effort, no problems with respiration noted  Abdomen: Soft, gravid, appropriate for gestational age.  Pain/Pressure: Absent     Pelvic: Cervical exam deferred        Extremities: Normal range of motion.  Edema: None  Mental Status: Normal mood and affect. Normal behavior. Normal judgment and thought content.   Assessment and Plan:  Pregnancy: G3P1001 at [redacted]w[redacted]d 1. Supervision of low-risk pregnancy, second trimester - Doing well, feeling regular and vigorous fetal movement  2. [redacted] weeks gestation of pregnancy - Routine OB care - Reviewed U/S results  Preterm labor symptoms and general obstetric precautions including but not limited to vaginal bleeding, contractions, leaking of fluid and fetal movement were reviewed in detail with the patient. Please refer to After Visit  Summary for other counseling recommendations.   Return in about 4 weeks (around 06/15/2021) for IN-PERSON, LOB.  Future Appointments  Date Time Provider Department Center  06/15/2021  9:55 AM Judeth Horn, NP [facility] None  07/14/2021 10:15 AM Raelyn Mora, CNM [facility] None  07/15/2021  8:20 AM [facility]    Bernerd Limbo, CNM

## 2021-06-07 ENCOUNTER — Encounter (HOSPITAL_COMMUNITY): Payer: Self-pay | Admitting: Emergency Medicine

## 2021-06-07 ENCOUNTER — Other Ambulatory Visit: Payer: Self-pay

## 2021-06-07 ENCOUNTER — Ambulatory Visit (HOSPITAL_COMMUNITY)
Admission: EM | Admit: 2021-06-07 | Discharge: 2021-06-07 | Disposition: A | Discharge status: Home / Self Care | Payer: Medicaid Other | Attending: Emergency Medicine | Admitting: Emergency Medicine

## 2021-06-07 ENCOUNTER — Inpatient Hospital Stay (HOSPITAL_COMMUNITY)
Admission: AD | Admit: 2021-06-07 | Discharge: 2021-06-07 | Disposition: A | Discharge status: Home / Self Care | Payer: Medicaid Other | Attending: Obstetrics & Gynecology | Admitting: Obstetrics & Gynecology

## 2021-06-07 DIAGNOSIS — O34219 Maternal care for unspecified type scar from previous cesarean delivery: Secondary | ICD-10-CM | POA: Diagnosis not present

## 2021-06-07 DIAGNOSIS — Z87891 Personal history of nicotine dependence: Secondary | ICD-10-CM | POA: Diagnosis not present

## 2021-06-07 DIAGNOSIS — Z79899 Other long term (current) drug therapy: Secondary | ICD-10-CM | POA: Diagnosis not present

## 2021-06-07 DIAGNOSIS — W57XXXA Bitten or stung by nonvenomous insect and other nonvenomous arthropods, initial encounter: Secondary | ICD-10-CM

## 2021-06-07 DIAGNOSIS — O99322 Drug use complicating pregnancy, second trimester: Secondary | ICD-10-CM | POA: Diagnosis not present

## 2021-06-07 DIAGNOSIS — S90862A Insect bite (nonvenomous), left foot, initial encounter: Secondary | ICD-10-CM | POA: Diagnosis not present

## 2021-06-07 DIAGNOSIS — N858 Other specified noninflammatory disorders of uterus: Secondary | ICD-10-CM | POA: Insufficient documentation

## 2021-06-07 DIAGNOSIS — O26892 Other specified pregnancy related conditions, second trimester: Secondary | ICD-10-CM | POA: Insufficient documentation

## 2021-06-07 DIAGNOSIS — R519 Headache, unspecified: Secondary | ICD-10-CM | POA: Diagnosis not present

## 2021-06-07 DIAGNOSIS — S90465A Insect bite (nonvenomous), left lesser toe(s), initial encounter: Secondary | ICD-10-CM | POA: Diagnosis not present

## 2021-06-07 DIAGNOSIS — O9A212 Injury, poisoning and certain other consequences of external causes complicating pregnancy, second trimester: Secondary | ICD-10-CM

## 2021-06-07 DIAGNOSIS — Z3A23 23 weeks gestation of pregnancy: Secondary | ICD-10-CM | POA: Diagnosis not present

## 2021-06-07 DIAGNOSIS — F1290 Cannabis use, unspecified, uncomplicated: Secondary | ICD-10-CM | POA: Insufficient documentation

## 2021-06-07 DIAGNOSIS — Z813 Family history of other psychoactive substance abuse and dependence: Secondary | ICD-10-CM | POA: Diagnosis not present

## 2021-06-07 DIAGNOSIS — R21 Rash and other nonspecific skin eruption: Secondary | ICD-10-CM | POA: Insufficient documentation

## 2021-06-07 MED ORDER — AMOXICILLIN 500 MG PO CAPS: 500.0000 mg | ORAL_CAPSULE | Freq: Three times a day (TID) | ORAL | 0 refills | Status: DC

## 2021-06-07 NOTE — Discharge Instructions (Signed)

## 2021-06-07 NOTE — ED Provider Notes (Signed)
[facility]    CSN: [id number] Arrival date & time: 06/07/21  1202      History   Chief Complaint Chief Complaint  Patient presents with   Insect Bite   Foot Swelling   Headache    HPI Gloria Berg is a 25 y.o. female.   Patient here for evaluation of redness and swelling to left fourth and fifth toes.  Reports redness and swelling has gotten progressively worse over the past several days.  Also reports having daily headaches for the past several days.  Reports having a tick bite between fourth and fifth toes on her left foot 3 to 4 weeks ago.  Has not used any OTC medications or treatments.  Patient is [redacted] weeks pregnant.  Denies any specific alleviating or aggravating factors.  Denies any fevers, chest pain, shortness of breath, N/V/D, numbness, tingling, weakness, abdominal pain, or headaches.    The history is provided by the patient.  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   Urinary tract infection     Patient Active Problem List   Diagnosis Date Noted   History of cesarean section 04/15/2021   Supervision of other normal pregnancy, antepartum 03/29/2021   Marijuana use 03/29/2021   Nausea and vomiting of pregnancy, antepartum 03/29/2021    Past Surgical History:  Procedure Laterality Date   CESAREAN SECTION N/A 10/18/2018   Procedure: CESAREAN SECTION;  Surgeon: Silverio Lay, MD;  Location: [facility]IRTHING SU[facility];  Service: Obstetrics;  Laterality: N/A;    OB History     Gravida  3   Para  1   Term  1   Preterm      AB      Living  1      SAB      IAB      Ectopic      Multiple  0   Live Births  1            Home Medications    Prior to Admission medications   Medication Sig Start Date End Date  Taking? Authorizing Provider  amoxicillin (AMOXIL) 500 MG capsule Take 1 capsule (500 mg total) by mouth 3 (three) times daily for 14 days. 06/07/21 06/21/21 Yes Ivette Loyal, NP  Blood Pressure Monitoring (BLOOD PRESSURE MONITOR AUTOMAT) DEVI 1 Device by Does not apply route daily. Automatic blood pressure cuff regular size. To monitor blood pressure regularly at home. ICD-10 code:Z34.90 03/29/21   Edd Arbour R, CNM  DICLEGIS 10-10 MG TBEC Take 2 tablets by mouth at bedtime as needed. 03/29/21   Bernerd Limbo, CNM  Misc. Devices (GOJJI WEIGHT SCALE) MISC 1 Device by Does not apply route daily as needed. To weight self daily as needed at home. ICD-10 code: Z34.90 03/29/21   Bernerd Limbo, CNM  ondansetron (ZOFRAN ODT) 4 MG disintegrating tablet Take 1 tablet (4 mg total) by mouth every 8 (eight) hours as needed for nausea or vomiting. 04/15/21   Bernerd Limbo, CNM  Prenatal 27-1 MG TABS Take 1 tablet by mouth daily at 6 (six) AM. 03/29/21   Bernerd Limbo, CNM    Family History Family History  Problem Relation Age of Onset   Drug  abuse Mother    Anxiety disorder Father        takes medication for   Drug abuse Maternal Grandfather        died of overdose   Drug abuse Maternal Aunt    Drug abuse Maternal Uncle     Social History Social History   Tobacco Use   Smoking status: Former    Types: Cigarettes    Quit date: 01/26/2021    Years since quitting: 0.3    Passive exposure: Never   Smokeless tobacco: Never  Vaping Use   Vaping Use: Never used  Substance Use Topics   Alcohol use: No    Comment: 1x June 2013   Drug use: Yes    Types: Marijuana    Comment: Last use a couple of weeks ago     Allergies   Patient has no known allergies.   Review of Systems Review of Systems  Skin:  Positive for rash and wound.  Neurological:  Positive for headaches.  All other systems reviewed and are negative.   Physical Exam Triage Vital Signs ED Triage Vitals  Enc  Vitals Group     BP 06/07/21 1358 117/73     Pulse Rate 06/07/21 1358 78     Resp 06/07/21 1358 18     Temp 06/07/21 1358 98.5 F (36.9 C)     Temp Source 06/07/21 1358 Oral     SpO2 06/07/21 1358 98 %     Weight --      Height --      Head Circumference --      Peak Flow --      Pain Score 06/07/21 1356 8     Pain Loc --      Pain Edu? --      Excl. in GC? --    No data found.  Updated Vital Signs BP 117/73 (BP Location: Right Arm)   Pulse 78   Temp 98.5 F (36.9 C) (Oral)   Resp 18   LMP 12/27/2020   SpO2 98%   Visual Acuity Right Eye Distance:   Left Eye Distance:   Bilateral Distance:    Right Eye Near:   Left Eye Near:    Bilateral Near:     Physical Exam Vitals and nursing note reviewed.  Constitutional:      General: She is not in acute distress.    Appearance: Normal appearance. She is not ill-appearing, toxic-appearing or diaphoretic.  HENT:     Head: Normocephalic and atraumatic.  Eyes:     Extraocular Movements: Extraocular movements intact.     Conjunctiva/sclera: Conjunctivae normal.     Pupils: Pupils are equal, round, and reactive to light.  Cardiovascular:     Rate and Rhythm: Normal rate.     Pulses: Normal pulses.     Heart sounds: Normal heart sounds.  Pulmonary:     Effort: Pulmonary effort is normal.     Breath sounds: Normal breath sounds.  Abdominal:     General: Abdomen is flat.  Musculoskeletal:        General: Normal range of motion.     Cervical back: Normal range of motion.  Skin:    General: Skin is warm and dry.     Findings: Rash (see photo below) present.  Neurological:     General: No focal deficit present.     Mental Status: She is alert and oriented to person, place, and time.     GCS: GCS eye  subscore is 4. GCS verbal subscore is 5. GCS motor subscore is 6.  Psychiatric:        Mood and Affect: Mood normal.      UC Treatments / Results  Labs (all labs ordered are listed, but only abnormal results are  displayed) Labs Reviewed  ROCKY MTN SPOTTED FVR ABS PNL(IGG+IGM)  LYME DISEASE SEROLOGY W/REFLEX    EKG   Radiology No results found.  Procedures Procedures (including critical care time)  Medications Ordered in UC Medications - No data to display  Initial Impression / Assessment and Plan / UC Course  I have reviewed the triage vital signs and the nursing notes.  Pertinent labs & imaging results that were available during my care of the patient were reviewed by me and considered in my medical decision making (see chart for details).    Assessment negative for red flags or concerns.  Tick bite of the left foot with rash, possible erythema migrans.  Lab work to check for [employer] spotted fever and Lyme disease pending.  We will go ahead and treat with amoxicillin 3 times a day for 14 days.  May take Tylenol as needed.  Encourage fluids and rest.  Follow-up with primary care provider for reevaluation. Final Clinical Impressions(s) / UC Diagnoses   Final diagnoses:  Tick bite of left foot, initial encounter  Rash and nonspecific skin eruption     Discharge Instructions      Take the amoxicillin three times a day for the next 14 days.   You can take Tylenol as needed for pain relief and fever reduction.  Make sure you are drinking plenty of fluids, especially water.   We will contact you if any of your lab work is positive.    Follow up with your primary care provider for re-evaluation.      ED Prescriptions     Medication Sig Dispense Auth. Provider   amoxicillin (AMOXIL) 500 MG capsule Take 1 capsule (500 mg total) by mouth 3 (three) times daily for 14 days. 42 capsule Ivette Loyal, NP      PDMP not reviewed this encounter.   Ivette Loyal, NP 06/07/21 1440

## 2021-06-07 NOTE — Discharge Instructions (Addendum)
Take the amoxicillin three times a day for the next 14 days.   You can take Tylenol as needed for pain relief and fever reduction.  Make sure you are drinking plenty of fluids, especially water.   We will contact you if any of your lab work is positive.    Follow up with your primary care provider for re-evaluation.

## 2021-06-07 NOTE — MAU Provider Note (Signed)
Event Date/Time   First Provider Initiated Contact with Patient 06/07/21 1135      S Ms. Gloria Berg is a 25 y.o. G3P1001 patient who presents to MAU today with complaint of a tick bite. She reports she had a tick bite between her 4th and 5th toes about 3 weeks ago. Now she is noticing worsening bruising.    O BP 114/66 (BP Location: Right Arm)   Pulse (!) 108   Temp 98.2 F (36.8 C) (Oral)   Resp 20   Ht 5\' 3"  (1.6 m)   Wt 85.6 kg   LMP 12/27/2020   SpO2 99%   BMI 33.44 kg/m  Physical Exam Vitals and nursing note reviewed.  Constitutional:      General: She is not in acute distress.    Appearance: She is well-developed.  HENT:     Head: Normocephalic.  Eyes:     Pupils: Pupils are equal, round, and reactive to light.  Cardiovascular:     Rate and Rhythm: Normal rate and regular rhythm.     Heart sounds: Normal heart sounds.  Pulmonary:     Effort: Pulmonary effort is normal. No respiratory distress.     Breath sounds: Normal breath sounds.  Abdominal:     General: Bowel sounds are normal. There is no distension.     Palpations: Abdomen is soft.     Tenderness: There is no abdominal tenderness.  Skin:    General: Skin is warm and dry.  Neurological:     Mental Status: She is alert and oriented to person, place, and time.  Psychiatric:        Mood and Affect: Mood normal.        Behavior: Behavior normal.        Thought Content: Thought content normal.        Judgment: Judgment normal.   FHT: 148 bpm  A Medical screening exam complete 1. Tick bite of lesser toe of left foot, initial encounter   2. [redacted] weeks gestation of pregnancy    -Called [facility] urgent care to verify that this is within the scope of the urgent care. Provider reports they can as long as cleared from pregnancy standpoint.   Patient will go to Urgent care now.  P Discharge from MAU in stable condition Patient given the option of transfer to [facility] for further evaluation or seek care in  outpatient facility of choice  List of options for follow-up given  Warning signs for worsening condition that would warrant emergency follow-up discussed Patient may return to MAU as needed   [facility], Rolm Bookbinder 06/07/2021 11:36 AM

## 2021-06-07 NOTE — ED Triage Notes (Signed)
Pt reports that pulled tick out from in between her left 4th and 5th toes 3 weeks age. Reports waking up with headaches everyday. Bruising and swelling has gotten worse around 4th and 5th toes.

## 2021-06-07 NOTE — MAU Note (Signed)
Presents stating she was bitten on her left pinky 3 weeks ago by a tick and thinks area is infected.  Reports area is worsening, getting red and causing increased pain.  Reports hurts to walk & stand. Denies pregnancy related issues.  No VB or LOF.  Reports no FM today.

## 2021-06-08 LAB — LYME DISEASE SEROLOGY W/REFLEX: Lyme Total Antibody EIA: NEGATIVE

## 2021-06-08 LAB — ROCKY MTN SPOTTED FVR ABS PNL(IGG+IGM)
RMSF IgG: NEGATIVE
RMSF IgM: 3.11 index — ABNORMAL HIGH (ref 0.00–0.89)

## 2021-06-10 ENCOUNTER — Telehealth (HOSPITAL_COMMUNITY): Payer: Self-pay | Admitting: Emergency Medicine

## 2021-06-10 ENCOUNTER — Telehealth: Payer: Self-pay

## 2021-06-10 MED ORDER — DOXYCYCLINE HYCLATE 100 MG PO CAPS: 100.0000 mg | ORAL_CAPSULE | Freq: Two times a day (BID) | ORAL | 0 refills | Status: AC

## 2021-06-10 MED ORDER — DOXYCYCLINE HYCLATE 100 MG PO CAPS
100.0000 mg | ORAL_CAPSULE | Freq: Two times a day (BID) | ORAL | 0 refills | Status: DC
Start: 2021-06-10 — End: 2021-06-10

## 2021-06-10 NOTE — Telephone Encounter (Signed)
Per CDC, doxycycline is the treatment of choice for [facility] spotted fever during pregnancy, particular because there are no suitable alternatives.  I discussed patient with Dr. Judeth Cornfield of MFM.  It appears that recent studies show that doxycycline use during pregnancy has minimal risk of tooth discoloration and disruption of long bone formation.  As [facility] spotted fever is potentially lethal and can cause a host of medical problems and the patient, it would be prudent to treat the patient with doxycycline.  Called patient and discussed with her CDC recommendations, potential but minimal risks of using the medication during pregnancy.  Patient amenable to treatment.  Recommended treatment for 10 days or at least 72 hours past resolution of headache, foot pain, rash.  Discussed warnings of when to call, in particular persistent fever, right upper quadrant abdominal pain, increasing foot pain with spread of discolored area.

## 2021-06-10 NOTE — Telephone Encounter (Signed)
Patient seen in urgent care on 06-07-21 for a tick bite. Patient has tested positive for [place] spider fever. Patient was given doxycycline for treatment but read she is not to take this medication while pregnant.    Patient case reviewed with Dr. Maryln Manuel reviewing case with MFM. Armandina Stammer RN   Dr. Adrian Blackwater to call patient to discuss. Armandina Stammer RN

## 2021-06-10 NOTE — Telephone Encounter (Signed)
Patient returned call and requested a different pharmacy

## 2021-06-15 ENCOUNTER — Other Ambulatory Visit: Payer: Self-pay

## 2021-06-15 ENCOUNTER — Encounter: Payer: Self-pay | Admitting: Student

## 2021-06-15 ENCOUNTER — Ambulatory Visit (INDEPENDENT_AMBULATORY_CARE_PROVIDER_SITE_OTHER): Payer: Medicaid Other | Admitting: Student

## 2021-06-15 VITALS — BP 112/71 | HR 118 | Temp 97.6°F | Wt 189.2 lb

## 2021-06-15 DIAGNOSIS — O26892 Other specified pregnancy related conditions, second trimester: Secondary | ICD-10-CM

## 2021-06-15 DIAGNOSIS — Z3A24 24 weeks gestation of pregnancy: Secondary | ICD-10-CM

## 2021-06-15 DIAGNOSIS — A770 Spotted fever due to Rickettsia rickettsii: Secondary | ICD-10-CM

## 2021-06-15 DIAGNOSIS — R87612 Low grade squamous intraepithelial lesion on cytologic smear of cervix (LGSIL): Secondary | ICD-10-CM | POA: Insufficient documentation

## 2021-06-15 DIAGNOSIS — Z98891 History of uterine scar from previous surgery: Secondary | ICD-10-CM

## 2021-06-15 DIAGNOSIS — Z3480 Encounter for supervision of other normal pregnancy, unspecified trimester: Secondary | ICD-10-CM

## 2021-06-15 DIAGNOSIS — R519 Headache, unspecified: Secondary | ICD-10-CM

## 2021-06-15 NOTE — Progress Notes (Signed)
   PRENATAL VISIT NOTE  Subjective:  Gloria Berg is a 25 y.o. G2P1001 at [redacted]w[redacted]d being seen today for ongoing prenatal care.  She is currently monitored for the following issues for this low-risk pregnancy and has Supervision of other normal pregnancy, antepartum; Marijuana use; Nausea and vomiting of pregnancy, antepartum; History of cesarean section; and LGSIL on Pap smear of cervix on their problem list.  Patient reports headache.  Reports continued headaches that have worsened with the pregnancy. Currently treated with tylenol & caffeine.  Taking antibiotics currently for [place] spotted fever.   Contractions: Not present. Vag. Bleeding: None.  Movement: Present. Denies leaking of fluid.   The following portions of the patient's history were reviewed and updated as appropriate: allergies, current medications, past family history, past medical history, past social history, past surgical history and problem list.   Objective:   Vitals:   06/15/21 1001  BP: 112/71  Pulse: (!) 118  Temp: 97.6 F (36.4 C)  Weight: 189 lb 3.2 oz (85.8 kg)    Fetal Status: Fetal Heart Rate (bpm): 149 Fundal Height: 25 cm Movement: Present     General:  Alert, oriented and cooperative. Patient is in no acute distress.  Skin: Skin is warm and dry. No rash noted.   Cardiovascular: Normal heart rate noted  Respiratory: Normal respiratory effort, no problems with respiration noted  Abdomen: Soft, gravid, appropriate for gestational age.  Pain/Pressure: Absent     Pelvic: Cervical exam deferred        Extremities: Normal range of motion.  Edema: None  Mental Status: Normal mood and affect. Normal behavior. Normal judgment and thought content.   Assessment and Plan:  Pregnancy: G2P1001 at [redacted]w[redacted]d  1. Supervision of other normal pregnancy, antepartum -discussed upcoming visits. Scheduled for 28 weeks labs to be collected at [facility] due to previous bad experience with [facility].   2. History of  cesarean section -desires repeat c/section. Will have appointment with MD>   3. Headache in pregnancy, antepartum, second trimester -will reach out to use if headaches become resistant to tylenol/caffeine  4. [facility] spotted fever -finishing out doxycycline -message to MFM to determine if any f/u is necessary  5. [redacted] weeks gestation of pregnancy    Preterm labor symptoms and general obstetric precautions including but not limited to vaginal bleeding, contractions, leaking of fluid and fetal movement were reviewed in detail with the patient. Please refer to After Visit Summary for other counseling recommendations.   No follow-ups on file.  Future Appointments  Date Time Provider Department Center  07/15/2021  8:20 AM [facility]  07/15/2021  9:15 AM Adam Phenix, MD [facility]    Judeth Horn, NP

## 2021-06-17 ENCOUNTER — Other Ambulatory Visit: Payer: Self-pay | Admitting: Student

## 2021-06-17 DIAGNOSIS — A770 Spotted fever due to Rickettsia rickettsii: Secondary | ICD-10-CM

## 2021-06-29 DIAGNOSIS — Z3482 Encounter for supervision of other normal pregnancy, second trimester: Secondary | ICD-10-CM | POA: Diagnosis not present

## 2021-06-29 DIAGNOSIS — Z3483 Encounter for supervision of other normal pregnancy, third trimester: Secondary | ICD-10-CM | POA: Diagnosis not present

## 2021-07-14 ENCOUNTER — Other Ambulatory Visit: Payer: Self-pay | Admitting: *Deleted

## 2021-07-14 ENCOUNTER — Encounter: Payer: Medicaid Other | Admitting: Obstetrics and Gynecology

## 2021-07-14 DIAGNOSIS — Z3480 Encounter for supervision of other normal pregnancy, unspecified trimester: Secondary | ICD-10-CM

## 2021-07-15 ENCOUNTER — Ambulatory Visit: Payer: Medicaid Other | Attending: Student

## 2021-07-15 ENCOUNTER — Other Ambulatory Visit: Payer: Medicaid Other

## 2021-07-15 ENCOUNTER — Ambulatory Visit (INDEPENDENT_AMBULATORY_CARE_PROVIDER_SITE_OTHER): Payer: Medicaid Other | Admitting: Obstetrics & Gynecology

## 2021-07-15 ENCOUNTER — Encounter: Payer: Self-pay | Admitting: *Deleted

## 2021-07-15 ENCOUNTER — Ambulatory Visit: Payer: Medicaid Other | Admitting: *Deleted

## 2021-07-15 ENCOUNTER — Other Ambulatory Visit: Payer: Self-pay

## 2021-07-15 VITALS — BP 112/67 | HR 112

## 2021-07-15 VITALS — BP 102/69 | HR 107 | Wt 193.2 lb

## 2021-07-15 DIAGNOSIS — O99213 Obesity complicating pregnancy, third trimester: Secondary | ICD-10-CM | POA: Diagnosis not present

## 2021-07-15 DIAGNOSIS — Z3A28 28 weeks gestation of pregnancy: Secondary | ICD-10-CM

## 2021-07-15 DIAGNOSIS — A770 Spotted fever due to Rickettsia rickettsii: Secondary | ICD-10-CM | POA: Diagnosis present

## 2021-07-15 DIAGNOSIS — Z23 Encounter for immunization: Secondary | ICD-10-CM

## 2021-07-15 DIAGNOSIS — Z3480 Encounter for supervision of other normal pregnancy, unspecified trimester: Secondary | ICD-10-CM

## 2021-07-15 DIAGNOSIS — O34219 Maternal care for unspecified type scar from previous cesarean delivery: Secondary | ICD-10-CM

## 2021-07-15 DIAGNOSIS — F419 Anxiety disorder, unspecified: Secondary | ICD-10-CM

## 2021-07-15 NOTE — Progress Notes (Signed)
   PRENATAL VISIT NOTE  Subjective:  Gloria Berg is a 25 y.o. G2P1001 at [redacted]w[redacted]d being seen today for ongoing prenatal care.  She is currently monitored for the following issues for this low-risk pregnancy and has Supervision of other normal pregnancy, antepartum; Marijuana use; Nausea and vomiting of pregnancy, antepartum; History of cesarean section; LGSIL on Pap smear of cervix; and [facility] spotted fever on their problem list.  Patient reports no complaints.  Contractions: Not present. Vag. Bleeding: None.  Movement: Present. Denies leaking of fluid.   The following portions of the patient's history were reviewed and updated as appropriate: allergies, current medications, past family history, past medical history, past social history, past surgical history and problem list.   Objective:   Vitals:   07/15/21 0855  BP: 102/69  Pulse: (!) 107  Weight: 193 lb 3.2 oz (87.6 kg)    Fetal Status: Fetal Heart Rate (bpm): 141   Movement: Present     General:  Alert, oriented and cooperative. Patient is in no acute distress.  Skin: Skin is warm and dry. No rash noted.   Cardiovascular: Normal heart rate noted  Respiratory: Normal respiratory effort, no problems with respiration noted  Abdomen: Soft, gravid, appropriate for gestational age.  Pain/Pressure: Absent     Pelvic: Cervical exam deferred        Extremities: Normal range of motion.  Edema: None  Mental Status: Normal mood and affect. Normal behavior. Normal judgment and thought content.   Assessment and Plan:  Pregnancy: G2P1001 at [redacted]w[redacted]d 1. [redacted] weeks gestation of pregnancy Routine labs - Tdap vaccine greater than or equal to 7yo IM  2. Anxiety  - Ambulatory referral to Integrated Behavioral Health Counseled rde TOLAC vs RCS and signed consent for RCS Preterm labor symptoms and general obstetric precautions including but not limited to vaginal bleeding, contractions, leaking of fluid and fetal movement were reviewed  in detail with the patient. Please refer to After Visit Summary for other counseling recommendations.   Return in about 2 weeks (around 07/29/2021).  Future Appointments  Date Time Provider Department Center  07/15/2021 12:45 PM [facility] NURSE [facility]  07/15/2021  1:00 PM [facility] US1 [facility]    Scheryl Darter, MD

## 2021-07-15 NOTE — Progress Notes (Signed)
Tdap vaccine administered into left arm without any complications. There were no questions or concerns.  Dawayne Patricia, CMA   07/15/21  9:09 am

## 2021-07-16 LAB — CBC
Hematocrit: 34.1 % (ref 34.0–46.6)
Hemoglobin: 11.3 g/dL (ref 11.1–15.9)
MCH: 28 pg (ref 26.6–33.0)
MCHC: 33.1 g/dL (ref 31.5–35.7)
MCV: 85 fL (ref 79–97)
Platelets: 295 10*3/uL (ref 150–450)
RBC: 4.03 x10E6/uL (ref 3.77–5.28)
RDW: 13.1 % (ref 11.7–15.4)
WBC: 12.6 10*3/uL — ABNORMAL HIGH (ref 3.4–10.8)

## 2021-07-16 LAB — GLUCOSE TOLERANCE, 2 HOURS W/ 1HR
Glucose, 1 hour: 177 mg/dL (ref 70–179)
Glucose, 2 hour: 127 mg/dL (ref 70–152)
Glucose, Fasting: 111 mg/dL — ABNORMAL HIGH (ref 70–91)

## 2021-07-16 LAB — HIV ANTIBODY (ROUTINE TESTING W REFLEX): HIV Screen 4th Generation wRfx: NONREACTIVE

## 2021-07-16 LAB — RPR: RPR Ser Ql: NONREACTIVE

## 2021-07-18 ENCOUNTER — Other Ambulatory Visit: Payer: Self-pay | Admitting: *Deleted

## 2021-07-18 ENCOUNTER — Telehealth: Payer: Self-pay | Admitting: Student

## 2021-07-18 DIAGNOSIS — A770 Spotted fever due to Rickettsia rickettsii: Secondary | ICD-10-CM

## 2021-07-18 DIAGNOSIS — O24419 Gestational diabetes mellitus in pregnancy, unspecified control: Secondary | ICD-10-CM

## 2021-07-18 MED ORDER — ACCU-CHEK FASTCLIX LANCET KIT: 1.0000 | PACK | Freq: Four times a day (QID) | 0 refills | Status: DC

## 2021-07-18 MED ORDER — ACCU-CHEK FASTCLIX LANCETS MISC: 1.0000 | Freq: Four times a day (QID) | 12 refills | Status: DC

## 2021-07-18 MED ORDER — ACCU-CHEK GUIDE VI STRP: 1.0000 | ORAL_STRIP | Freq: Four times a day (QID) | 12 refills | Status: DC

## 2021-07-18 MED ORDER — ACCU-CHEK GUIDE W/DEVICE KIT: 1.0000 | PACK | Freq: Four times a day (QID) | 0 refills | Status: DC

## 2021-07-18 NOTE — Telephone Encounter (Signed)
Received a call from Ms. Gloria Berg who was returning my call from this morning. She stated she would like to stay at the [facility]. She also stated she was suppose to make an appointment to see the diabetes educator because she failed her test. I informed her to call that office and make an appointment.

## 2021-07-21 NOTE — BH Specialist Note (Signed)
Integrated Behavioral Health via Telemedicine Visit  07/21/2021 Gloria Berg [id number]  Number of Integrated Behavioral Health visits: 1 Session Start time: 3:25  Session End time: 4:03 Total time:  78  Referring Provider: Scheryl Darter, MD Patient/Family location: [facility] Provider location: [facility] for [facility]  All persons participating in visit: [name] and [facility] Cervando Durnin   Types of Service: Individual psychotherapy and Video visit  I connected with Gloria Berg and/or [name]  n/a  via  Telephone or [employer]  (Video is [employer]or, [employer]) and verified that I am speaking with the correct person using two identifiers. Discussed confidentiality: Yes   I discussed the limitations of telemedicine and the availability of in person appointments.  Discussed there is a possibility of technology failure and discussed alternative modes of communication if that failure occurs.  I discussed that engaging in this telemedicine visit, they consent to the provision of behavioral healthcare and the services will be billed under their insurance.  Patient and/or legal guardian expressed understanding and consented to Telemedicine visit: Yes   Presenting Concerns: Patient and/or family reports the following symptoms/concerns: Worry, anxiety, overthinking, fatigue, no motivation; open to implementing self-coping strategy.  Duration of problem: increase in current pregnanc y; Severity of problem:  moderately severe  Patient and/or Family's Strengths/Protective Factors: Concrete supports in place (healthy food, safe environments, etc.) and Sense of purpose  Goals Addressed: Patient will:  Reduce symptoms of: anxiety and depression   Increase knowledge and/or ability of: coping skills   Demonstrate ability to: Increase healthy adjustment to current life circumstances  Progress  towards Goals: Ongoing  Interventions: Interventions utilized:  Mindfulness or Management consultant, Psychoeducation and/or Health Education, and Link to [employer] Standardized Assessments completed:  PHQ9/GAD7 given in past two weeks  Patient and/or Family Response: Pt agrees with treatment plan  Assessment: Patient currently experiencing Adjustment disorder with mixed anxious and depressed mood.   Patient may benefit from psychoeducation and brief therapeutic interventions regarding coping with symptoms of anxiety, depression .  Plan: Follow up with behavioral health clinician on : Two weeks Behavioral recommendations:  -Continue taking prenatal vitamin daily, per medical provider -CALM relaxation breathing exercise twice daily (morning; at bedtime) -Consider additional resources on After Visit Summary (postpartum planner; grief resource) Referral(s): Integrated [employer] (In Clinic)  I discussed the assessment and treatment plan with the patient and/or parent/guardian. They were provided an opportunity to ask questions and all were answered. They agreed with the plan and demonstrated an understanding of the instructions.   They were advised to call back or seek an in-person evaluation if the symptoms worsen or if the condition fails to improve as anticipated.  Rae Lips, LCSW  Depression screen [facility] 2/9 07/15/2021 04/15/2021 03/29/2021 02/01/2021  Decreased Interest 1 2 3 2   Down, Depressed, Hopeless 2 2 1 2   PHQ - 2 Score 3 4 4 4   Altered sleeping 3 3 3 2   Tired, decreased energy 3 3 3 3   Change in appetite 0 2 3 0  Feeling bad or failure about yourself  0 1 1 0  Trouble concentrating 1 0 0 3  Moving slowly or fidgety/restless 3 2 0 2  Suicidal thoughts 0 0 0 0  PHQ-9 Score 13 15 14 14   Difficult doing work/chores - Not difficult at all Not difficult at all -   GAD 7 : Generalized Anxiety Score 07/15/2021 04/15/2021 03/29/2021  02/01/2021  Nervous,  Anxious, on Edge 3 3 3 3   Control/stop worrying 2 3 3 2   Worry too much - different things 3 3 2 2   Trouble relaxing 3 1 1 2   Restless 1 1 0 2  Easily annoyed or irritable 3 3 3 3   Afraid - awful might happen 2 2 3 3   Total GAD 7 Score 17 16 15 17   Anxiety Difficulty - Not difficult at all Not difficult at all -

## 2021-07-27 ENCOUNTER — Ambulatory Visit (INDEPENDENT_AMBULATORY_CARE_PROVIDER_SITE_OTHER): Payer: Medicaid Other | Admitting: Clinical

## 2021-07-27 DIAGNOSIS — F4323 Adjustment disorder with mixed anxiety and depressed mood: Secondary | ICD-10-CM

## 2021-07-27 NOTE — Patient Instructions (Signed)
[facility] [place], [place] [address] [phone] (main office) [phone] [facility] office)  [email or web address]  [facility] (Individual and group grief support) [email or web address]  [phone]         BRAINSTORMING  Develop a Plan Goals: Provide a way to start conversation about your new life with a baby Assist parents in recognizing and using resources within their reach Help pave the way before birth for an easier period of transition afterwards.  Make a list of the following information to keep in a central location: Full name of Mom and Partner: _____________________________________________ 69 full name and Date of Birth: ___________________________________________ Home Address: ___________________________________________________________ ________________________________________________________________________ Home Phone: ____________________________________________________________ Parents' cell numbers: _____________________________________________________ ________________________________________________________________________ Name and contact info for OB: ______________________________________________ Name and contact info for Pediatrician:________________________________________ Contact info for Lactation Consultants: ________________________________________  REST and SLEEP *You each need at least 4-5 hours of uninterrupted sleep every day. Write specific names and contact information.* How are you going to rest in the postpartum period? While partner's home? When partner returns to work? When you both return to work? Where will your baby sleep? Who is available to help during the day? Evening? Night? Who could move in for a period to help support you? What are some ideas to help you get enough  sleep? __________________________________________________________________________________________________________________________________________________________________________________________________________________________________________ NUTRITIOUS FOOD AND DRINK *Plan for meals before your baby is born so you can have healthy food to eat during the immediate postpartum period.* Who will look after breakfast? Lunch? Dinner? List names and contact information. Brainstorm quick, healthy ideas for each meal. What can you do before baby is born to prepare meals for the postpartum period? How can others help you with meals? Which grocery stores provide online shopping and delivery? Which restaurants offer take-out or delivery options? ______________________________________________________________________________________________________________________________________________________________________________________________________________________________________________________________________________________________________________________________________________________________________________________________________  CARE FOR MOM *It's important that mom is cared for and pampered in the postpartum period. Remember, the most important ways new mothers need care are: sleep, nutrition, gentle exercise, and time off.* Who can come take care of mom during this period? Make a list of people with their contact information. List some activities that make you feel cared for, rested, and energized? Who can make sure you have opportunities to do these things? Does mom have a space of her very own within your home that's just for her? Make a "[facility]" where she can be comfortable, rest, and renew herself  daily. ______________________________________________________________________________________________________________________________________________________________________________________________________________________________________________________________________________________________________________________________________________________________________________________________________    CARE FOR AND FEEDING BABY *Knowledgeable and encouraging people will offer the best support with regard to feeding your baby.* Educate yourself and choose the best feeding option for your baby. Make a list of people who will guide, support, and be a resource for you as your care for and feed your baby. (Friends that have breastfed or are currently breastfeeding, lactation consultants, breastfeeding support groups, etc.) Consider a postpartum doula. (These websites can give you information: dona.org & BuyingShow.es) Seek out local breastfeeding resources like the breastfeeding support group at [employer] or [employer]. ______________________________________________________________________________________________________________________________________________________________________________________________________________________________________________________________________________________________________________________________________________________________________________________________________  Verner Chol AND ERRANDS Who can help with a thorough cleaning before baby is born? Make a list of people who will help with housekeeping and chores, like laundry, light cleaning, dishes, bathrooms, etc. Who can run some errands for you? What can you do to make sure you are stocked with basic supplies before baby is born? Who is going to do the  shopping? ______________________________________________________________________________________________________________________________________________________________________________________________________________________________________________________________________________________________________________________________________________________________________________________________________     Family Adjustment *Nurture yourselves.it helps parents be more loving and  allows for better bonding with their child.* What sorts of things do you and partner enjoy doing together? Which activities help you to connect and strengthen your relationship? Make a list of those things. Make a list of people whom you trust to care for your baby so you can have some time together as a couple. What types of things help partner feel connected to Mom? Make a list. What needs will partner have in order to bond with baby? Other children? Who will care for them when you go into labor and while you are in the hospital? Think about what the needs of your older children might be. Who can help you meet those needs? In what ways are you helping them prepare for bringing baby home? List some specific strategies you have for family adjustment. _______________________________________________________________________________________________________________________________________________________________________________________________________________________________________________________________________________________________________________________________________________  SUPPORT *Someone who can empathize with experiences normalizes your problems and makes them more bearable.* Make a list of other friends, neighbors, and/or co-workers you know with infants (and small children, if applicable) with whom you can connect. Make a list of local or online support groups, mom groups, etc. in which you can be  involved. ______________________________________________________________________________________________________________________________________________________________________________________________________________________________________________________________________________________________________________________________________________________________________________________________________  Childcare Plans Investigate and plan for childcare if mom is returning to work. Talk about mom's concerns about her transition back to work. Talk about partner's concerns regarding this transition.  Mental Health *Your mental health is one of the highest priorities for a pregnant or postpartum mom.* 1 in 5 women experience anxiety and/or depression from the time of conception through the first year after birth. Postpartum Mood Disorders are the #1 complication of pregnancy and childbirth and the suffering experienced by these mothers is not necessary! These illnesses are temporary and respond well to treatment, which often includes self-care, social support, talk therapy, and medication when needed. Women experiencing anxiety and depression often say things like: "I'm supposed to be happy.why do I feel so sad?", "Why can't I snap out of it?", "I'm having thoughts that scare me." There is no need to be embarrassed if you are feeling these symptoms: Overwhelmed, anxious, angry, sad, guilty, irritable, hopeless, exhausted but can't sleep You are NOT alone. You are NOT to blame. With help, you WILL be well. Where can I find help? Medical professionals such as your OB, midwife, gynecologist, family practitioner, primary care provider, pediatrician, or mental health providers; [facility] support groups: Feelings After Birth, Breastfeeding Support Group, Baby and Me Group, and Fit 4 Two exercise classes. You have permission to ask for help. It will confirm your feelings, validate your experiences,  share/learn coping strategies, and gain support and encouragement as you heal. You are important! BRAINSTORM Make a list of local resources, including resources for mom and for partner. Identify support groups. Identify people to call late at night - include names and contact info. Talk with partner about perinatal mood and anxiety disorders. Talk with your OB, midwife, and doula about baby blues and about perinatal mood and anxiety disorders. Talk with your pediatrician about perinatal mood and anxiety disorders.   Support & Sanity Savers   What do you really need?  Basics In preparing for a new baby, many expectant parents spend hours shopping for baby clothes, decorating the nursery, and deciding which car seat to buy. Yet most don't think much about what the reality of parenting a newborn will be like, and what they need to make it through that. So, here is the advice of experienced parents. We know you'll read this, and think "they're  exaggerating, I don't really need that." Just trust [place] on these, OK? Plan for all of this, and if it turns out you don't need it, come back and teach [place] how you did it!  Must-Haves (Once baby's survival needs are met, make sure you attend to your own survival needs!) Sleep An average newborn sleeps 16-18 hours per day, over 6-7 sleep periods, rarely more than three hours at a time. It is normal and healthy for a newborn to wake throughout the night... but really hard on parents!! Naps. Prioritize sleep above any responsibilities like: cleaning house, visiting friends, running errands, etc.  Sleep whenever baby sleeps. If you can't nap, at least have restful times when baby eats. The more rest you get, the more patient you will be, the more emotionally stable, and better at solving problems.  Food You may not have realized it would be difficult to eat when you have a newborn. Yet, when we talk to countless new parents, they say things like "it may be 2:00 pm  when I realize I haven't had breakfast yet." Or "every time we sit down to dinner, baby needs to eat, and my food gets cold, so I don't bother to eat it." Finger food. Before your baby is born, stock up with one months' worth of food that: 1) you can eat with one hand while holding a baby, 2) doesn't need to be prepped, 3) is good hot or cold, 4) doesn't spoil when left out for a few hours, and 5) you like to eat. Think about: nuts, dried fruit, Clif bars, pretzels, jerky, gogurt, baby carrots, apples, bananas, crackers, cheez-n-crackers, string cheese, hot pockets or frozen burritos to microwave, garden burgers and breakfast pastries to put in the toaster, yogurt drinks, etc. Restaurant Menus. Make lists of your favorite restaurants & menu items. When family/friends want to help, you can give specific information without much thought. They can either bring you the food or send gift cards for just the right meals. Freezer Meals.  Take some time to make a few meals to put in the freezer ahead of time.  Easy to freeze meals can be anything such as soup, lasagna, chicken pie, or spaghetti sauce. Set up a Meal Schedule.  Ask friends and family to sign up to bring you meals during the first few weeks of being home. (It can be passed around at baby showers!) You have no idea how helpful this will be until you are in the throes of parenting.  https://hamilton-woodard.com/ is a great website to check out. Emotional Support Know who to call when you're stressed out. Parenting a newborn is very challenging work. There are times when it totally overwhelms your normal coping abilities. EVERY NEW PARENT NEEDS TO HAVE A PLAN FOR WHO TO CALL WHEN THEY JUST CAN'T COPE ANY MORE. (And it has to be someone other than the baby's other parent!) Before your baby is born, come up with at least one person you can call for support - write their phone number down and post it on the refrigerator. Anxiety & Sadness. Baby blues are normal after  pregnancy; however, there are more severe types of anxiety & sadness which can occur and should not be ignored.  They are always treatable, but you have to take the first step by reaching out for help. [facility] offers a "Mom Talk" group which meets every Tuesday from 10 am - 11 am.  This group is for new moms who need support and  connection after their babies are born.  Call [phone].  Really, Really Helpful (Plan for them! Make sure these happen often!!) Physical Support with Taking Care of Yourselves Asking friends and family. Before your baby is born, set up a schedule of people who can come and visit and help out (or ask a friend to schedule for you). Any time someone says "let me know what I can do to help," sign them up for a day. When they get there, their job is not to take care of the baby (that's your job and your joy). Their job is to take care of you!  Postpartum doulas. If you don't have anyone you can call on for support, look into postpartum doulas:  professionals at helping parents with caring for baby, caring for themselves, getting breastfeeding started, and helping with household tasks. www.padanc.org is a helpful website for learning about doulas in our area. Peer Support / Parent Groups Why: One of the greatest ideas for new parents is to be around other new parents. Parent groups give you a chance to share and listen to others who are going through the same season of life, get a sense of what is normal infant development by watching several babies learn and grow, share your stories of triumph and struggles with empathetic ears, and forgive your own mistakes when you realize all parents are learning by trial and error. Where to find: There are many places you can meet other new parents throughout our community.  [facility] offers the following classes for new moms and their little ones:  Baby and Me (Birth to [place]) and Breastfeeding Support Group. Go to  [email or web address] or call [phone] for more information. Time for your Relationship It's easy to get so caught up in meeting baby's immediate needs that it's hard to find time to connect with your partner, and meet the needs of your relationship. It's also easy to forget what "quality time with your partner" actually looks like. If you take your baby on a date, you'd be amazed how much of your couple time is spent feeding the baby, diapering the baby, admiring the baby, and talking about the baby. Dating: Try to take time for just the two of you. Babysitter tip: Sometimes when moms are breastfeeding a newborn, they find it hard to figure out how to schedule outings around baby's unpredictable feeding schedules. Have the babysitter come for a three hour period. When she comes over, if baby has just eaten, you can leave right away, and come back in two hours. If baby hasn't fed recently, you start the date at home. Once baby gets hungry and gets a good feeding in, you can head out for the rest of your date time. Date Nights at Home: If you can't get out, at least set aside one evening a week to prioritize your relationship: whenever baby dozes off or doesn't have any immediate needs, spend a little time focusing on each other. Potential conflicts: The main relationship conflicts that come up for new parents are: issues related to sexuality, financial stresses, a feeling of an unfair division of household tasks, and conflicts in parenting styles. The more you can work on these issues before baby arrives, the better!  Fun and Frills (Don't forget these. and don't feel guilty for indulging in them!) Everyone has something in life that is a fun little treat that they do just for themselves. It may be: reading the morning paper, or going for a daily jog, or  having coffee with a friend once a week, or going to a movie on Friday nights, or fine chocolates, or bubble baths, or curling up with a good  book. Unless you do fun things for yourself every now and then, it's hard to have the energy for fun with your baby. Whatever your "special" treats are, make sure you find a way to continue to indulge in them after your baby is born. These special moments can recharge you, and allow you to return to baby with a new joy   PERINATAL MOOD DISORDERS: [place]   _________________________________________Emergency and Crisis Resources If you are an imminent risk to self or others, are experiencing intense personal distress, and/or have noticed significant changes in activities of daily living, call:  [place]: [phone]  [address], [place], [place], [address] Mobile Crisis: [place]: 988 Or visit the following crisis centers: Local Emergency Departments [facility]: [address] [place]. Hours: 8:30AM-5PM. Insurance Accepted: Medicaid, Medicare, and Uninsured.  [facility]:  [address] [place]  Mon-Friday 8am-3pm, [phone]                                                                                  ___________ Non-Crisis Resources To identify specific providers that are covered by your insurance, contact your insurance company or local agencies:  [place]: [phone] [facility] [employer]: [place]: [phone] Postpartum Support International- Warm-line: 630-462-8324                                                      __Outpatient Therapy and Medication Management   Providers:  [facility]: [phone] Hours: 9AM-5PM  Insurance Accepted: Alben Spittle, Shane Crutch, [place], [place] [facility]): [phone] Hours: 8AM-5:30PM  nsurance Accepted: All insurances EXCEPT AARP, [place],  [place], and [place]: [phone] Hours: 8AM-8PM Insurance Accepted: Cristal Ford, Freddrick March, [place], Medicare, [phone] [facility]: [phone] Hours: 8:30AM-7PM Insurance Accepted: Cristal Ford, Medicaid, Medicare, Tricare, [employer] Counseling:  [place] Accepted:  Holland Falling, Lorella Nimrod, [employer], [place]: [phone] Hours: 9AM-5:30PM Insurance Accepted: Alben Spittle, Charlotte Crumb, and Medicaid, Medicare, [facility]:  [phone] Hours: 9am-5pm Insurance Accepted: BCBS; they do not accept Medicaid/Medicare [facility] [place]: [phone] Hours: 9am-9pm Insurance Accepted: All major insurance including Medicaid and Medicare [facility]: [phone] Hours: [place] Accepted: All insurances EXCEPT Medicaid and Medicare. [facility]: [phone]   ____________  Parenting Support Groups [facility]: [phone] [facility]:  [phone] Family Support Network: (support for children in the NICU and/or with special needs), [phone]   ___________                                                                 Mental Health Support Groups Mental Health Association: [phone]    _____________                                                                                  Online Resources Postpartum Support International: http://www.postpartum.net/  800-944-4PPD 2Moms Supporting Moms:  www.momssupportingmoms.net    

## 2021-07-28 NOTE — BH Specialist Note (Signed)
Pt did not arrive to video visit and did not answer the phone; Left HIPPA-compliant message to call back Julius Matus from [facility] at  [phone] (Devon Pretty's office).  ?; left MyChart message for patient.  ? ?

## 2021-08-01 ENCOUNTER — Ambulatory Visit (INDEPENDENT_AMBULATORY_CARE_PROVIDER_SITE_OTHER): Payer: Medicaid Other

## 2021-08-01 ENCOUNTER — Other Ambulatory Visit: Payer: Self-pay

## 2021-08-01 VITALS — BP 110/75 | HR 131 | Wt 194.7 lb

## 2021-08-01 DIAGNOSIS — M5431 Sciatica, right side: Secondary | ICD-10-CM

## 2021-08-01 DIAGNOSIS — O0990 Supervision of high risk pregnancy, unspecified, unspecified trimester: Secondary | ICD-10-CM

## 2021-08-01 DIAGNOSIS — O24410 Gestational diabetes mellitus in pregnancy, diet controlled: Secondary | ICD-10-CM

## 2021-08-01 MED ORDER — CYCLOBENZAPRINE HCL 10 MG PO TABS: 10.0000 mg | ORAL_TABLET | Freq: Three times a day (TID) | ORAL | 1 refills | Status: DC | PRN

## 2021-08-01 NOTE — Progress Notes (Addendum)
    Subjective:  Gloria Berg is a 25 y.o. G2P1001 at [redacted]w[redacted]d being seen today for ongoing prenatal care.  She is currently monitored for the following issues for this high-risk pregnancy and has Supervision of other normal pregnancy, antepartum; Marijuana use; Nausea and vomiting of pregnancy, antepartum; History of cesarean section; LGSIL on Pap smear of cervix; and [facility] spotted fever on their problem list.  Patient reports  sciatic nerve pain, pelvic cramping, and headache . Pt reports she had a fall and injury to her back several years ago and developed sciatica during her last pregnancy, for which she was given a Flexeril during pregnancy. She started developing sciatica at the start of this pregnancy and it has worsened throughout. Pt reports pain unrelieved with tylenol or stretching. Pt also reports headaches which have been occurring regularly since her [facility] Spotted Fever diagnosis. Denies any new pattern or intensity of headaches. Denies vision changes, RUQ pain, CP, SOB. Contractions: Not present. Vag. Bleeding: None.  Movement: Present. Denies leaking of fluid.   Pt was recently diagnosed with gestational diabetes. Has not picked up blood sugar monitoring supplies as she went to the wrong pharmacy. Pt plans to pick up supplies after today's visit and start monitoring.   The following portions of the patient's history were reviewed and updated as appropriate: allergies, current medications, past family history, past medical history, past social history, past surgical history and problem list.   Objective:   Vitals:   08/01/21 1532  BP: 110/75  Pulse: (!) 131  Weight: 194 lb 11.2 oz (88.3 kg)    Fetal Status: Fetal Heart Rate (bpm): 138 Fundal Height: 32 cm Movement: Present     General:  Alert, oriented and cooperative. Patient is in no acute distress.  Skin: Skin is warm and dry. No rash noted.   Cardiovascular: Normal heart rate noted  Respiratory: Normal  respiratory effort, no problems with respiration noted  Abdomen: Soft, gravid, appropriate for gestational age. Pain/Pressure: Absent     Pelvic:  Cervical exam deferred        Extremities: Normal range of motion.  Edema: None  Mental Status: Normal mood and affect. Normal behavior. Normal judgment and thought content.   Urinalysis:      Assessment and Plan:  Pregnancy: G2P1001 at [redacted]w[redacted]d Gestational diabetes mellitus controlled with diet - Pt to pick up blood glucose monitoring supplies at pharmacy after today's visit. Educated patient on importance of diet and monitoring. - Scheduled f/u appointment with diabetes educator  Supervision of high-risk pregnancy; [redacted] weeks gestation of pregnancy; history of cesarean section - Desires repeat c-section, scheduled for 09/26/21  Headache in pregnancy - Managed well on tylenol/caffeine. Educated on f/u if headaches get worse, become resistant, or if she develops vision changes, RUQ pain, CP, SOB.   4. Sciatica - Unrelieved with stretching/Tylenol - Flexeril rx sent to pharmacy   Return in about 2 weeks (around 08/15/2021).   Attestation of Supervision of Student:  I confirm that I have verified the information documented in the  resident  student's note and that I have also personally reperformed the history, physical exam and all medical decision making activities.  I have verified that all services and findings are accurately documented in this student's note; and I agree with management and plan as outlined in the documentation. I have also made any necessary editorial changes.  Brand Males, CNM  08/01/21 7:37 PM

## 2021-08-01 NOTE — Progress Notes (Signed)
Patient stated that she is not having pain but is uncomfortable. Gloria Berg wants to know if she could be prescribed something for her sciatic nerve pain

## 2021-08-09 ENCOUNTER — Other Ambulatory Visit: Payer: Self-pay | Admitting: *Deleted

## 2021-08-09 DIAGNOSIS — O24419 Gestational diabetes mellitus in pregnancy, unspecified control: Secondary | ICD-10-CM

## 2021-08-10 ENCOUNTER — Ambulatory Visit: Payer: Medicaid Other | Admitting: Clinical

## 2021-08-10 DIAGNOSIS — Z91199 Patient's noncompliance with other medical treatment and regimen due to unspecified reason: Secondary | ICD-10-CM

## 2021-08-12 ENCOUNTER — Other Ambulatory Visit: Payer: Self-pay

## 2021-08-12 ENCOUNTER — Ambulatory Visit: Payer: Medicaid Other

## 2021-08-16 ENCOUNTER — Other Ambulatory Visit: Payer: Self-pay

## 2021-08-16 ENCOUNTER — Encounter: Payer: Medicaid Other | Attending: Student | Admitting: Registered"

## 2021-08-16 ENCOUNTER — Ambulatory Visit: Payer: Medicaid Other | Admitting: Registered"

## 2021-08-16 ENCOUNTER — Ambulatory Visit: Payer: Medicaid Other | Attending: Obstetrics

## 2021-08-16 ENCOUNTER — Encounter: Payer: Self-pay | Admitting: *Deleted

## 2021-08-16 ENCOUNTER — Encounter: Payer: Self-pay | Admitting: Advanced Practice Midwife

## 2021-08-16 ENCOUNTER — Ambulatory Visit: Payer: Medicaid Other | Admitting: *Deleted

## 2021-08-16 ENCOUNTER — Encounter: Payer: Medicaid Other | Admitting: Advanced Practice Midwife

## 2021-08-16 ENCOUNTER — Ambulatory Visit (INDEPENDENT_AMBULATORY_CARE_PROVIDER_SITE_OTHER): Payer: Medicaid Other | Admitting: Advanced Practice Midwife

## 2021-08-16 VITALS — BP 115/82 | HR 124 | Wt 195.0 lb

## 2021-08-16 VITALS — BP 106/69 | HR 97

## 2021-08-16 DIAGNOSIS — O24419 Gestational diabetes mellitus in pregnancy, unspecified control: Secondary | ICD-10-CM | POA: Diagnosis not present

## 2021-08-16 DIAGNOSIS — O99213 Obesity complicating pregnancy, third trimester: Secondary | ICD-10-CM

## 2021-08-16 DIAGNOSIS — E669 Obesity, unspecified: Secondary | ICD-10-CM | POA: Diagnosis not present

## 2021-08-16 DIAGNOSIS — O34219 Maternal care for unspecified type scar from previous cesarean delivery: Secondary | ICD-10-CM

## 2021-08-16 DIAGNOSIS — Z3480 Encounter for supervision of other normal pregnancy, unspecified trimester: Secondary | ICD-10-CM

## 2021-08-16 DIAGNOSIS — Z3A00 Weeks of gestation of pregnancy not specified: Secondary | ICD-10-CM | POA: Insufficient documentation

## 2021-08-16 DIAGNOSIS — A770 Spotted fever due to Rickettsia rickettsii: Secondary | ICD-10-CM | POA: Insufficient documentation

## 2021-08-16 DIAGNOSIS — Z3A33 33 weeks gestation of pregnancy: Secondary | ICD-10-CM

## 2021-08-16 NOTE — Progress Notes (Signed)
   PRENATAL VISIT NOTE  Subjective:  Gloria Berg is a 25 y.o. G2P1001 at [redacted]w[redacted]d being seen today for ongoing prenatal care.  She is currently monitored for the following issues for this high-risk pregnancy and has Supervision of other normal pregnancy, antepartum; Marijuana use; Nausea and vomiting of pregnancy, antepartum; History of cesarean section; LGSIL on Pap smear of cervix; and [facility] spotted fever on their problem list.  Patient reports no complaints.  Contractions: Irritability. Vag. Bleeding: None.  Movement: Present. Denies leaking of fluid.   The following portions of the patient's history were reviewed and updated as appropriate: allergies, current medications, past family history, past medical history, past social history, past surgical history and problem list.   Objective:   Vitals:   08/16/21 1030  BP: 115/82  Pulse: (!) 124  Weight: 88.5 kg    Fetal Status: Fetal Heart Rate (bpm): 139 Fundal Height: 34 cm Movement: Present     General:  Alert, oriented and cooperative. Patient is in no acute distress.  Skin: Skin is warm and dry. No rash noted.   Cardiovascular: Normal heart rate noted  Respiratory: Normal respiratory effort, no problems with respiration noted  Abdomen: Soft, gravid, appropriate for gestational age.  Pain/Pressure: Present     Pelvic: Cervical exam deferred        Extremities: Normal range of motion.  Edema: None  Mental Status: Normal mood and affect. Normal behavior. Normal judgment and thought content.   Assessment and Plan:  Pregnancy: G2P1001 at [redacted]w[redacted]d 1. Supervision of other normal pregnancy, antepartum - has appt to see DM educator today - Has not started checking blood glucose, per MFM baby was measuring big today. Report not available at around 30 weeks baby was 74%tile.   2. [redacted] weeks gestation of pregnancy   Preterm labor symptoms and general obstetric precautions including but not limited to vaginal bleeding,  contractions, leaking of fluid and fetal movement were reviewed in detail with the patient. Please refer to After Visit Summary for other counseling recommendations.   Return in about 2 weeks (around 08/30/2021).  Future Appointments  Date Time Provider Department Center  08/16/2021 11:15 AM [facility]  09/14/2021  3:30 PM [facility] NURSE [facility]  09/14/2021  3:45 PM [facility] US1 [facility]    Thressa Sheller DNP, CNM  08/16/21  11:00 AM

## 2021-08-16 NOTE — Progress Notes (Signed)
Patient was seen for Gestational Diabetes self-management on 08/16/21  Start time 1115 and End time 1150   Estimated due date: 10/03/20; [redacted]w[redacted]d  Clinical: Medications: reviewed Medical History: reviewed Labs: OGTT 111(H)-177-127, A1c n/a %   Dietary and Lifestyle History: Patient is accompanied by her husband. Pt states she did not have GDM with previous pregnancy.  Patient has had 2 appointments prior to today's visit and is tired and nauseous. Education was abbreviated for patient's comfort  Physical Activity: limited due to SOB with minimal activity Stress: not assessed Sleep: not assessed  24 hr Recall:  First Meal: frosted flakes, diet Mtn Dew Snack: fruit (oranges green apples) Second meal: nacho fries, burrito (75 g cho) baja blast  Snack: fruit Third meal: corn dog Snack: fruit Beverages: Diet Mt Dew, water Diet Dr. Reino Kent, juicy juice  NUTRITION INTERVENTION  Nutrition education (E-1) on the following topics:   Initial Follow-up  [x]  []  Definition of Gestational Diabetes [x]  []  Why dietary management is important in controlling blood glucose [x]  []  Effects each nutrient has on blood glucose levels [x]  []  Simple carbohydrates vs complex carbohydrates []  []  Fluid intake [x]  []  Creating a balanced meal plan [x]  []  Carbohydrate counting  [x]  []  When to check blood glucose levels [x]  []  Proper blood glucose monitoring techniques [x]  []  Effect of stress and stress reduction techniques  [x]  []  Exercise effect on blood glucose levels, appropriate exercise during pregnancy [x]  []  Importance of limiting caffeine and abstaining from alcohol and smoking [x]  []  Medications used for blood sugar control during pregnancy [x]  []  Hypoglycemia and rule of 15 [x]  []  Postpartum self care  Blood glucose monitor given: Accu-chek Guide Me Lot Exp: 10/21/2022 CBG: 106 mg/dL   Patient instructed to monitor glucose levels: FBS: 60 - ? 95 mg/dL (some clinics use 90 for  cutoff) 1 hour: ? 140 mg/dL 2 hour: ? mg/dL  Patient received handouts: Nutrition Diabetes and Pregnancy Carbohydrate Counting List  Patient will be seen for follow-up as needed.

## 2021-08-17 ENCOUNTER — Other Ambulatory Visit: Payer: Self-pay | Admitting: *Deleted

## 2021-08-17 DIAGNOSIS — O34219 Maternal care for unspecified type scar from previous cesarean delivery: Secondary | ICD-10-CM

## 2021-08-17 DIAGNOSIS — A770 Spotted fever due to Rickettsia rickettsii: Secondary | ICD-10-CM

## 2021-08-17 DIAGNOSIS — O24419 Gestational diabetes mellitus in pregnancy, unspecified control: Secondary | ICD-10-CM

## 2021-08-17 DIAGNOSIS — Z6832 Body mass index (BMI) 32.0-32.9, adult: Secondary | ICD-10-CM

## 2021-08-19 NOTE — BH Specialist Note (Signed)
Pt did not arrive to video visit and did not answer the phone; Left HIPPA-compliant message to call back Tremeka Helbling from [facility] at  [phone] (Deundra Bard's office).  ?; left MyChart message for patient.  ? ?

## 2021-08-31 ENCOUNTER — Other Ambulatory Visit: Payer: Self-pay

## 2021-08-31 ENCOUNTER — Ambulatory Visit (INDEPENDENT_AMBULATORY_CARE_PROVIDER_SITE_OTHER): Payer: Medicaid Other | Admitting: Family Medicine

## 2021-08-31 ENCOUNTER — Encounter: Payer: Self-pay | Admitting: Family Medicine

## 2021-08-31 VITALS — BP 110/78 | HR 109 | Wt 197.9 lb

## 2021-08-31 DIAGNOSIS — Z3480 Encounter for supervision of other normal pregnancy, unspecified trimester: Secondary | ICD-10-CM

## 2021-08-31 DIAGNOSIS — O24410 Gestational diabetes mellitus in pregnancy, diet controlled: Secondary | ICD-10-CM

## 2021-08-31 DIAGNOSIS — R87612 Low grade squamous intraepithelial lesion on cytologic smear of cervix (LGSIL): Secondary | ICD-10-CM

## 2021-08-31 DIAGNOSIS — F419 Anxiety disorder, unspecified: Secondary | ICD-10-CM

## 2021-08-31 DIAGNOSIS — O24419 Gestational diabetes mellitus in pregnancy, unspecified control: Secondary | ICD-10-CM | POA: Insufficient documentation

## 2021-08-31 DIAGNOSIS — Z98891 History of uterine scar from previous surgery: Secondary | ICD-10-CM

## 2021-08-31 NOTE — Progress Notes (Signed)
Fasting 100's Dinner 100's reports 2 low readings of 80's

## 2021-08-31 NOTE — Progress Notes (Signed)
° °  Subjective:  Gloria Berg is a 25 y.o. G2P1001 at [redacted]w[redacted]d being seen today for ongoing prenatal care.  She is currently monitored for the following issues for this high-risk pregnancy and has Supervision of other normal pregnancy, antepartum; Marijuana use; Nausea and vomiting of pregnancy, antepartum; History of cesarean section; LGSIL on Pap smear of cervix; and [facility] spotted fever on their problem list.  Patient reports no complaints.  Contractions: Irritability. Vag. Bleeding: None.  Movement: Present. Denies leaking of fluid.   The following portions of the patient's history were reviewed and updated as appropriate: allergies, current medications, past family history, past medical history, past social history, past surgical history and problem list. Problem list updated.  Objective:   Vitals:   08/31/21 1626  BP: 110/78  Pulse: (!) 109  Weight: 197 lb 14.4 oz (89.8 kg)    Fetal Status: Fetal Heart Rate (bpm): 132   Movement: Present     General:  Alert, oriented and cooperative. Patient is in no acute distress.  Skin: Skin is warm and dry. No rash noted.   Cardiovascular: Normal heart rate noted  Respiratory: Normal respiratory effort, no problems with respiration noted  Abdomen: Soft, gravid, appropriate for gestational age. Pain/Pressure: Present     Pelvic: Vag. Bleeding: None     Cervical exam deferred        Extremities: Normal range of motion.  Edema: None  Mental Status: Normal mood and affect. Normal behavior. Normal judgment and thought content.   Urinalysis:      Assessment and Plan:  Pregnancy: G2P1001 at [redacted]w[redacted]d  1. Supervision of other normal pregnancy, antepartum BP and FHR normal Discussed contraception, thinking about hormonal IUD but still deciding  2. History of cesarean section Scheduled for RCS on 09/26/2021  3. LGSIL on Pap smear of cervix Repeat pap in 1 year  4. Gestational diabetes mellitus Controlled based on recall Will bring  log to next visit Last growth [place] 12/6, AFI 11, EFW 89%  Preterm labor symptoms and general obstetric precautions including but not limited to vaginal bleeding, contractions, leaking of fluid and fetal movement were reviewed in detail with the patient. Please refer to After Visit Summary for other counseling recommendations.  Return in 1 week (on 09/07/2021) for [facility], ob visit.   Venora Maples, MD

## 2021-08-31 NOTE — Patient Instructions (Signed)

## 2021-09-01 ENCOUNTER — Ambulatory Visit: Payer: Medicaid Other | Admitting: Clinical

## 2021-09-01 DIAGNOSIS — Z91199 Patient's noncompliance with other medical treatment and regimen due to unspecified reason: Secondary | ICD-10-CM

## 2021-09-14 ENCOUNTER — Ambulatory Visit: Payer: Medicaid Other | Attending: Obstetrics and Gynecology

## 2021-09-14 ENCOUNTER — Inpatient Hospital Stay (HOSPITAL_COMMUNITY): Payer: Medicaid Other

## 2021-09-14 ENCOUNTER — Encounter (HOSPITAL_COMMUNITY): Payer: Self-pay | Admitting: Obstetrics and Gynecology

## 2021-09-14 ENCOUNTER — Ambulatory Visit (INDEPENDENT_AMBULATORY_CARE_PROVIDER_SITE_OTHER): Payer: Medicaid Other | Admitting: Family Medicine

## 2021-09-14 ENCOUNTER — Other Ambulatory Visit: Payer: Self-pay

## 2021-09-14 ENCOUNTER — Inpatient Hospital Stay (HOSPITAL_COMMUNITY)
Admission: AD | Admit: 2021-09-14 | Discharge: 2021-09-14 | Disposition: A | Discharge status: Home / Self Care | Payer: Medicaid Other | Attending: Obstetrics and Gynecology | Admitting: Obstetrics and Gynecology

## 2021-09-14 ENCOUNTER — Ambulatory Visit: Payer: Medicaid Other

## 2021-09-14 ENCOUNTER — Encounter: Payer: Self-pay | Admitting: Family Medicine

## 2021-09-14 VITALS — BP 115/78 | HR 144 | Wt 195.1 lb

## 2021-09-14 DIAGNOSIS — Z3480 Encounter for supervision of other normal pregnancy, unspecified trimester: Secondary | ICD-10-CM

## 2021-09-14 DIAGNOSIS — R0602 Shortness of breath: Secondary | ICD-10-CM

## 2021-09-14 DIAGNOSIS — R000 Tachycardia, unspecified: Secondary | ICD-10-CM | POA: Diagnosis not present

## 2021-09-14 DIAGNOSIS — Z98891 History of uterine scar from previous surgery: Secondary | ICD-10-CM

## 2021-09-14 DIAGNOSIS — Z3A37 37 weeks gestation of pregnancy: Secondary | ICD-10-CM | POA: Insufficient documentation

## 2021-09-14 DIAGNOSIS — O99513 Diseases of the respiratory system complicating pregnancy, third trimester: Secondary | ICD-10-CM | POA: Diagnosis not present

## 2021-09-14 DIAGNOSIS — R87612 Low grade squamous intraepithelial lesion on cytologic smear of cervix (LGSIL): Secondary | ICD-10-CM

## 2021-09-14 DIAGNOSIS — J45909 Unspecified asthma, uncomplicated: Secondary | ICD-10-CM | POA: Diagnosis not present

## 2021-09-14 DIAGNOSIS — Z3689 Encounter for other specified antenatal screening: Secondary | ICD-10-CM | POA: Insufficient documentation

## 2021-09-14 DIAGNOSIS — O24410 Gestational diabetes mellitus in pregnancy, diet controlled: Secondary | ICD-10-CM

## 2021-09-14 LAB — CBC
HCT: 34.5 % — ABNORMAL LOW (ref 36.0–46.0)
Hemoglobin: 11.3 g/dL — ABNORMAL LOW (ref 12.0–15.0)
MCH: 25.6 pg — ABNORMAL LOW (ref 26.0–34.0)
MCHC: 32.8 g/dL (ref 30.0–36.0)
MCV: 78.1 fL — ABNORMAL LOW (ref 80.0–100.0)
Platelets: 320 10*3/uL (ref 150–400)
RBC: 4.42 MIL/uL (ref 3.87–5.11)
RDW: 14.8 % (ref 11.5–15.5)
WBC: 9.1 10*3/uL (ref 4.0–10.5)
nRBC: 0 % (ref 0.0–0.2)

## 2021-09-14 LAB — BRAIN NATRIURETIC PEPTIDE: B Natriuretic Peptide: 11.3 pg/mL (ref 0.0–100.0)

## 2021-09-14 MED ORDER — IOHEXOL 350 MG/ML SOLN
80.0000 mL | Freq: Once | INTRAVENOUS | Status: AC | PRN
  Administered 2021-09-14: 80 mL via INTRAVENOUS

## 2021-09-14 NOTE — MAU Note (Signed)
Sent from routine appt, pulse was elevated.  (126 when first arrived in triage). Feels short of breath at times. Feels like an asthma attack. At times sees white dots.  Denies dizziness.  Hx of anemic, lately has really been chewing on a lot of ice. Denies pain, no chest pain.  Occ braxton hicks, no bleeding or LOF Feels super tired.

## 2021-09-14 NOTE — Discharge Instructions (Signed)
-  Return to MAU if any dizziness, fainting spells, or chest pain -we will follow up with you in the next two days about a cardiology appointment

## 2021-09-14 NOTE — Progress Notes (Signed)
° °  Subjective:  Gloria Berg is a 26 y.o. G2P1001 at [redacted]w[redacted]d being seen today for ongoing prenatal care.  She is currently monitored for the following issues for this high-risk pregnancy and has Supervision of other normal pregnancy, antepartum; Marijuana use; Nausea and vomiting of pregnancy, antepartum; History of cesarean section; LGSIL on Pap smear of cervix; [facility] spotted fever; and Gestational diabetes mellitus (GDM) on their problem list.  Patient reports  shortness of breath .  Contractions: Irritability. Vag. Bleeding: None.  Movement: Present. Denies leaking of fluid.   The following portions of the patient's history were reviewed and updated as appropriate: allergies, current medications, past family history, past medical history, past social history, past surgical history and problem list. Problem list updated.  Objective:   Vitals:   09/14/21 1133  BP: 115/78  Pulse: (!) 144  Weight: 195 lb 1.6 oz (88.5 kg)    Fetal Status: Fetal Heart Rate (bpm): 152   Movement: Present     General:  Alert, oriented and cooperative. Patient is in no acute distress.  Skin: Skin is warm and dry. No rash noted.   Cardiovascular: Normal heart rate noted  Respiratory: Mildly increased RR and WOB  Abdomen: Soft, gravid, appropriate for gestational age. Pain/Pressure: Present     Pelvic: Vag. Bleeding: None     Cervical exam deferred        Extremities: Normal range of motion.  Edema: None  Mental Status: Normal mood and affect. Normal behavior. Normal judgment and thought content.   Urinalysis:      Assessment and Plan:  Pregnancy: G2P1001 at [redacted]w[redacted]d  1. Supervision of other normal pregnancy, antepartum BP and FHR normal However she is markedly tachycardic, solidly in the 140-150's throughout our visit on pulse ox Reports she has been short of breath for a few months but has been significantly worse of late Feels like she has to stop constantly to catch her breath Has mildly  increased WOB and RR on exam Overall concerning for PE, sent to MAU for eval and signout called to MAU provider  2. Diet controlled gestational diabetes mellitus (GDM) in third trimester Controlled on recall  Has growth [place] later today  3. LGSIL on Pap smear of cervix Repeat pap in 1 year  4. History of cesarean section Scheduled for RCS on 09/26/21 Discussed contraception, would like hormonal IUD at time of cesarean  Term labor symptoms and general obstetric precautions including but not limited to vaginal bleeding, contractions, leaking of fluid and fetal movement were reviewed in detail with the patient. Please refer to After Visit Summary for other counseling recommendations.  Return in 1 week (on 09/21/2021) for [facility], ob visit.   Venora Maples, MD

## 2021-09-14 NOTE — Patient Instructions (Signed)

## 2021-09-14 NOTE — MAU Provider Note (Addendum)
[name] is a 26 y.o. G2P1001 at [redacted]w[redacted]d here with complaints of SOB. She reports that it has been increasingly getting worse when she walks. She denies contractions, VB, LOF, decreased fetal movements, dysuria, NV. She denies fever, chills, HA, chest pain. She denies wheezing, coughing or other cardiac/pulmonary complaint.  She has a repeat c/section scheduled for 09-26-2021.  Patient was sent from [facility] for evaluation after presenting with SOB at prenatal visit today. Per Dr. Crissie Reese, patient's pulse is usually elevated at prenatal visits but patient appeared more distressed and with difficulty breathing today. She was advised to come to MAU for evaluation.  History     CSN: [id number]  Arrival date and time: 09/14/21 1210   None     Chief Complaint  Patient presents with   Tachycardia   Shortness of Breath   Shortness of Breath This is a chronic problem. The current episode started 1 to 4 weeks ago. The problem occurs constantly. The problem has been gradually worsening. Pertinent negatives include no abdominal pain, chest pain, headaches, syncope or vomiting.  She notices it when she is walking from her living to kitchen. She has a history of asthma in childhood but none in adulthood. She does not use an inhaler.    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   Urinary tract infection     Past Surgical History:  Procedure Laterality Date   CESAREAN SECTION N/A 10/18/2018   Procedure: CESAREAN SECTION;  Surgeon: Silverio Lay, MD;  Location: [facility] BIRTHING SUITES;  Service:  Obstetrics;  Laterality: N/A;    Family History  Problem Relation Age of Onset   Drug abuse Mother    Anxiety disorder Father        takes medication for   Diabetes Maternal Grandfather    Drug abuse Maternal Aunt    Drug abuse Maternal Uncle     Social History   Tobacco Use   Smoking status: Former    Types: Cigarettes    Quit date: 01/26/2021    Years since quitting: 0.6    Passive exposure: Never   Smokeless tobacco: Never  Vaping Use   Vaping Use: Never used  Substance Use Topics   Alcohol use: No    Comment: 1x June 2013   Drug use: Yes    Types: Marijuana    Comment: last use was july 2022    Allergies: No Known Allergies  No medications prior to admission.    Review of Systems  Constitutional: Negative.   HENT: Negative.    Respiratory:  Positive for shortness of breath.   Cardiovascular:  Negative for chest pain and syncope.  Gastrointestinal:  Negative for abdominal pain and vomiting.  Genitourinary: Negative.   Neurological: Negative.  Negative for headaches.  Hematological: Negative.  Psychiatric/Behavioral: Negative.    Physical Exam   Blood pressure 111/89, pulse 96, temperature 98.8 F (37.1 C), temperature source Oral, resp. rate 17, height 5\' 3"  (1.6 m), weight 89 kg, last menstrual period 12/27/2020, SpO2 100 %, unknown if currently breastfeeding.  Physical Exam Constitutional:      Appearance: She is well-developed and normal weight.  Abdominal:     Palpations: Abdomen is soft.  Musculoskeletal:        General: Normal range of motion.  Skin:    General: Skin is warm and dry.  Neurological:     General: No focal deficit present.     Mental Status: She is alert.  Psychiatric:        Mood and Affect: Mood normal.    MAU Course  Procedures  MDM -NST: 150 bpm, mod var, present acel, no contractions -chest ray is clear; no active disease -CT normal; no signs of PE  -BNP normal -EKG normal but with sinus tachycardia -patient was  monitored for several hours while in MAU: she had no acute events and was ambulating, reclining and talking without difficulty. Her breathing was unlabored and she did not appear distressed Patient Vitals for the past 24 hrs:  BP Temp Temp src Pulse Resp SpO2 Height Weight  09/14/21 1735 111/89 98.8 F (37.1 C) Oral 96 17 100 % -- --  09/14/21 1530 -- -- -- (!) 108 -- 99 % -- --  09/14/21 1500 -- -- -- (!) 106 -- 100 % -- --  09/14/21 1430 -- -- -- (!) 108 -- -- -- --  09/14/21 1400 -- -- -- (!) 116 -- 98 % -- --  09/14/21 1330 -- -- -- (!) 114 -- -- -- --  09/14/21 1310 -- -- -- (!) 128 -- 97 % -- --  09/14/21 1254 92/74 -- Oral (!) 119 20 99 % -- --  09/14/21 1224 (!) 108/56 97.9 F (36.6 C) Oral (!) 130 20 98 % 5\' 3"  (1.6 m) 89 kg  09/14/21 1222 -- -- -- -- -- 98 % -- --    Assessment and Plan   1. SOB (shortness of breath)   2. Supervision of other normal pregnancy, antepartum   3. NST (non-stress test) reactive   4. Sinus tachycardia   5. [redacted] weeks gestation of pregnancy   --patient stable for discharge; chart reviewed with Dr. who agrees and recommends patient get outpatient echo.  -patient stable for discharge; low index of suspicion for PE -referral placed to [facility] for appt; will also message Cards directly -All questions answered, strict return precautions reviewed; patient knows that we will contact her ASAP about her outpatient appt with cardiology. Patient and partner appreciated the care and attention during their MAU stay  Crissie Reese [facility] 09/15/2021, 5:54 AM

## 2021-09-15 ENCOUNTER — Telehealth: Payer: Self-pay

## 2021-09-15 ENCOUNTER — Other Ambulatory Visit: Payer: Self-pay | Admitting: Student

## 2021-09-15 NOTE — BH Specialist Note (Deleted)
Integrated Behavioral Health via Telemedicine Visit  09/15/2021 Gloria Berg [id number]  Number of [place] visits: 2 Session Start time: 3:!5***  Session End time: 3:45*** Total time: {IBH Total Time:21014050}  Referring Provider: *** Patient/Family location: Home*** [facility] Provider location: [facility] [place] [facility]  All persons participating in visit: Patient *** and [place] ***  Types of Service: {CHL AMB TYPE OF [phone]}  I connected with [place] and/or [place] {family members:20773} via  Telephone or [employer]  (Video is Tree surgeon) and verified that I am speaking with the correct person using two identifiers. Discussed confidentiality: {YES/NO:21197}  I discussed the limitations of telemedicine and the availability of in person appointments.  Discussed there is a possibility of technology failure and discussed alternative modes of communication if that failure occurs.  I discussed that engaging in this telemedicine visit, they consent to the provision of behavioral healthcare and the services will be billed under their insurance.  Patient and/or legal guardian expressed understanding and consented to Telemedicine visit: {YES/NO:21197}  Presenting Concerns: Patient and/or family reports the following symptoms/concerns: *** Duration of problem: ***; Severity of problem: {Mild/Moderate/Severe:20260}  Patient and/or Family's Strengths/Protective Factors: {CHL AMB BH PROTECTIVE FACTORS:[phone]}  Goals Addressed: Patient will:  Reduce symptoms of: {IBH Symptoms:21014056}   Increase knowledge and/or ability of: {IBH Patient Tools:21014057}   Demonstrate ability to: {IBH Goals:21014053}  Progress towards Goals: {CHL AMB BH PROGRESS TOWARDS GOALS:[phone]}  Interventions: Interventions utilized:  {IBH  Interventions:21014054} Standardized Assessments completed: {IBH Screening Tools:21014051}  Patient and/or Family Response: ***  Assessment: Patient currently experiencing ***.   Patient may benefit from ***.  Plan: Follow up with behavioral health clinician on : *** Behavioral recommendations: *** Referral(s): {IBH Referrals:21014055}  I discussed the assessment and treatment plan with the patient and/or parent/guardian. They were provided an opportunity to ask questions and all were answered. They agreed with the plan and demonstrated an understanding of the instructions.   They were advised to call back or seek an in-person evaluation if the symptoms worsen or if the condition fails to improve as anticipated.  Caroleen Hamman Evolett [place], LCSW

## 2021-09-15 NOTE — Telephone Encounter (Signed)
Per Susanne Borders, CNM pt needs to have an Echo for cardiac evaluation.  [facility] contacted at [phone] and was informed that pt has an appt 09/16/2021 @ 1600.    Addison Naegeli, RN  09/15/2021

## 2021-09-16 ENCOUNTER — Other Ambulatory Visit: Payer: Self-pay

## 2021-09-16 ENCOUNTER — Ambulatory Visit (INDEPENDENT_AMBULATORY_CARE_PROVIDER_SITE_OTHER): Payer: Medicaid Other | Admitting: Cardiology

## 2021-09-16 ENCOUNTER — Ambulatory Visit (INDEPENDENT_AMBULATORY_CARE_PROVIDER_SITE_OTHER): Payer: Medicaid Other

## 2021-09-16 ENCOUNTER — Encounter: Payer: Self-pay | Admitting: Cardiology

## 2021-09-16 VITALS — BP 114/86 | HR 109 | Ht 63.0 in | Wt 197.0 lb

## 2021-09-16 DIAGNOSIS — R0602 Shortness of breath: Secondary | ICD-10-CM | POA: Diagnosis not present

## 2021-09-16 DIAGNOSIS — R55 Syncope and collapse: Secondary | ICD-10-CM

## 2021-09-16 DIAGNOSIS — R002 Palpitations: Secondary | ICD-10-CM

## 2021-09-16 NOTE — Patient Instructions (Signed)
Medication Instructions:  °Your physician recommends that you continue on your current medications as directed. Please refer to the Current Medication list given to you today. ° °*If you need a refill on your cardiac medications before your next appointment, please call your pharmacy* ° ° °Lab Work: °NONE ordered at this time of appointment  ° °If you have labs (blood work) drawn today and your tests are completely normal, you will receive your results only by: °MyChart Message (if you have MyChart) OR °A paper copy in the mail °If you have any lab test that is abnormal or we need to change your treatment, we will call you to review the results. ° ° °Testing/Procedures: °Your physician has requested that you have an echocardiogram. Echocardiography is a painless test that uses sound waves to create images of your heart. It provides your doctor with information about the size and shape of your heart and how well your heart’s chambers and valves are working. This procedure takes approximately one hour. There are no restrictions for this procedure. ° °Cardiac-OB patient: to be performed by [facility] or [facility].  ° °ZIO XT- Long Term Monitor Instructions ° °Your physician has requested you wear a ZIO patch monitor for  7 days.  °This is a single patch monitor. [employer] supplies one patch monitor per enrollment. Additional °stickers are not available. Please do not apply patch if you will be having a Nuclear Stress Test,  °Echocardiogram, Cardiac CT, MRI, or Chest Xray during the period you would be wearing the  °monitor. The patch cannot be worn during these tests. You cannot remove and re-apply the  °ZIO XT patch monitor.  °Your ZIO patch monitor will be mailed 3 day USPS to your address on file. It may take 3-5 days  °to receive your monitor after you have been enrolled.  °Once you have received your monitor, please review the enclosed instructions. Your monitor  °has already been registered assigning a specific monitor  serial # to you. ° °Billing and Patient Assistance Program Information ° °We have supplied [employer] with any of your insurance information on file for billing purposes. °[employer] offers a sliding scale Patient Assistance Program for patients that do not have  °insurance, or whose insurance does not completely cover the cost of the ZIO monitor.  °You must apply for the Patient Assistance Program to qualify for this discounted rate.  °To apply, please call [employer] at [phone], select option 4, select option 2, ask to apply for  °Patient Assistance Program. [employer] will ask your household income, and how many people  °are in your household. They will quote your out-of-pocket cost based on that information.  °[employer] will also be able to set up a 12-month, interest-free payment plan if needed. ° °Applying the monitor °  °Shave hair from upper left chest.  °Hold abrader disc by orange tab. Rub abrader in 40 strokes over the upper left chest as  °indicated in your monitor instructions.  °Clean area with 4 enclosed alcohol pads. Let dry.  °Apply patch as indicated in monitor instructions. Patch will be placed under collarbone on left  °side of chest with arrow pointing upward.  °Rub patch adhesive wings for 2 minutes. Remove white label marked "1". Remove the white  °label marked "2". Rub patch adhesive wings for 2 additional minutes.  °While looking in a mirror, press and release button in center of patch. A small green light will  °flash 3-4 times. This will be your only indicator that the   monitor has been turned on.  °Do not shower for the first 24 hours. You may shower after the first 24 hours.  °Press the button if you feel a symptom. You will hear a small click. Record Date, Time and  °Symptom in the Patient Logbook.  °When you are ready to remove the patch, follow instructions on the last 2 pages of Patient  °Logbook. Stick patch monitor onto the last page of Patient Logbook.  °Place Patient Logbook in the blue  and white box. Use locking tab on box and tape box closed  °securely. The blue and white box has prepaid postage on it. Please place it in the mailbox as  °soon as possible. Your physician should have your test results approximately 7 days after the  °monitor has been mailed back to [employer].  °Call [employer] Customer Care at [phone] if you have questions regarding  °your ZIO XT patch monitor. Call them immediately if you see an orange light blinking on your  °monitor.  °If your monitor falls off in less than 4 days, contact our Monitor department at [phone].  °If your monitor becomes loose or falls off after 4 days call [employer] at [phone] for  °suggestions on securing your monitor ° ° ° °Follow-Up: °At [facility], you and your health needs are our priority.  As part of our continuing mission to provide you with exceptional heart care, we have created designated Provider Care Teams.  These Care Teams include your primary Cardiologist (physician) and Advanced Practice Providers (APPs -  Physician Assistants and Nurse Practitioners) who all work together to provide you with the care you need, when you need it. ° °We recommend signing up for the patient portal called "MyChart".  Sign up information is provided on this After Visit Summary.  MyChart is used to connect with patients for Virtual Visits (Telemedicine).  Patients are able to view lab/test results, encounter notes, upcoming appointments, etc.  Non-urgent messages can be sent to your provider as well.   °To learn more about what you can do with MyChart, go to [email or web address].   ° °Your next appointment:   °12 week(s) ° °The format for your next appointment:   °In Person ° °Provider:   °Kardie Tobb, DO °[address], [place], [place] [address] ° °Other Instructions ° ° °

## 2021-09-16 NOTE — Progress Notes (Signed)
Cardio-Obstetrics Clinic  New Evaluation  Date:  09/16/2021   ID:  Gloria Berg, DOB [birth year], MRN [id number]  PCP:  Pcp, No   [facility] Providers Cardiologist:  Berniece Salines, DO  Electrophysiologist:  None       Referring MD: Luna Glasgow*   Chief Complaint: " I am really short of breath"  History of Present Illness:    Gloria Berg is a 26 y.o. female [G2P1001] who is being seen today for the evaluation of shortness of breath at the request of Luna Glasgow*.   She has a of childhood asthma, depression, GERD who is here today to be evaluated for worsening shortness of breath.  She tells me that at times she gets short of breath and exertion.  She notes that this is getting worse.  She has known asthma unfortunately she has not been treated recently.  But she says she has not been 1 any inhalers for her asthma. She also tells me that she has been experiencing worsening palpitations with some lightheadedness.  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   ondansetron (ZOFRAN ODT) 4 MG disintegrating  tablet Take 1 tablet (4 mg total) by mouth every 8 (eight) hours as needed for nausea or vomiting.   Prenatal 27-1 MG TABS Take 1 tablet by mouth daily at 6 (six) AM.     Allergies:   Patient has no known allergies.   Social History   Socioeconomic History   Marital status: Single    Spouse name: Not on file   Number of children: 1   Years of education: Not on file   Highest education level: High school graduate  Occupational History   Not on file  Tobacco Use   Smoking status: Former    Types: Cigarettes    Quit date: 01/26/2021    Years since quitting: 0.6    Passive exposure: Never   Smokeless tobacco: Never  Vaping Use   Vaping Use: Never used  Substance and Sexual Activity   Alcohol use: No    Comment: 1x June 2013   Drug use: Yes    Types: Marijuana    Comment: last use was july 2022   Sexual activity: Yes    Birth control/protection: None  Other Topics  Concern   Not on file  Social History Narrative   Not on file   Social Determinants of Health   Financial Resource Strain: Not on file  Food Insecurity: No Food Insecurity   Worried About Running Out of Food in the Last Year: Never true   Ran Out of Food in the Last Year: Never true  Transportation Needs: No Transportation Needs   Lack of Transportation (Medical): No   Lack of Transportation (Non-Medical): No  Physical Activity: Not on file  Stress: Not on file  Social Connections: Not on file      Family History  Problem Relation Age of Onset   Drug abuse Mother    Anxiety disorder Father        takes medication for   Diabetes Maternal Grandfather    Drug abuse Maternal Aunt    Drug abuse Maternal Uncle       ROS:   Review of Systems  Constitution: Negative for decreased appetite, fever and weight gain.  HENT: Negative for congestion, ear discharge, hoarse voice and sore throat.   Eyes: Negative for discharge, redness, vision loss in right eye and visual halos.  Cardiovascular: Negative for  chest pain, dyspnea on exertion, leg swelling, orthopnea and palpitations.  Respiratory: Reports shortness of breath.  Negative for cough, hemoptysis, and snoring.   Endocrine: Negative for heat intolerance and polyphagia.  Hematologic/Lymphatic: Negative for bleeding problem. Does not bruise/bleed easily.  Skin: Negative for flushing, nail changes, rash and suspicious lesions.  Musculoskeletal: Negative for arthritis, joint pain, muscle cramps, myalgias, neck pain and stiffness.  Gastrointestinal: Negative for abdominal pain, bowel incontinence, diarrhea and excessive appetite.  Genitourinary: Negative for decreased libido, genital sores and incomplete emptying.  Neurological: Negative for brief paralysis, focal weakness, headaches and loss of balance.  Psychiatric/Behavioral: Negative for altered mental status, depression and suicidal ideas.  Allergic/Immunologic: Negative for HIV exposure and persistent infections.     Labs/EKG Reviewed:    EKG:   EKG is was not ordered today.   Recent Labs: 01/29/2021: ALT 14; BUN 6; Creatinine, Ser 0.55; Potassium 3.6; Sodium 138 09/14/2021: B Natriuretic Peptide 11.3; Hemoglobin 11.3; Platelets 320   Recent Lipid Panel No results found for: CHOL, TRIG, HDL, CHOLHDL, LDLCALC, LDLDIRECT  Physical Exam:    VS:  BP 114/86    Pulse (!) 109    Ht '5\' 3"'  (1.6 m)    Wt 197 lb (89.4 kg)    LMP 12/27/2020 (Exact Date)    SpO2 98%    BMI 34.90 kg/m     Wt Readings from Last 3 Encounters:  09/16/21 197 lb (89.4 kg)  09/14/21 196 lb 1.6 oz (89 kg)  09/14/21 195 lb 1.6 oz (88.5 kg)     GEN:  Well nourished, well developed in no acute distress HEENT: Normal NECK: No JVD; No carotid bruits LYMPHATICS: No lymphadenopathy CARDIAC: RRR, no murmurs, rubs, gallops RESPIRATORY:  Clear to auscultation without rales, wheezing or rhonchi  ABDOMEN: Soft, non-tender, non-distended MUSCULOSKELETAL:  No edema; No deformity  SKIN: Warm and dry NEUROLOGIC:  Alert  and oriented x 3 PSYCHIATRIC:  Normal affect    Risk Assessment/Risk Calculators:     CARPREG II Risk Prediction Index Score:  1.  The patient's risk for a primary cardiac event is 5%.            ASSESSMENT & PLAN:    Shortness of breath -Her shortness of breath could be multifactorial and I think she should be assessed  for her asthma which currently not treated.  In addition for completeness we will get an echocardiogram to assess for any LV dysfunction and structural abnormalities. Will place a monitor on the patient to make sure her palpitations and lightheadedness is not due to any significant arrhythmia. She was advised to limit her pregnancy weight gain between 11 to 20 pounds.      Patient Instructions  Medication Instructions:  Your physician recommends that you continue on your current medications as directed. Please refer to the Current Medication list given to you today.  *If you need a refill on your cardiac medications before your next appointment, please call your pharmacy*  Lab Work: NONE ordered at this time of appointment   If you have labs (blood work) drawn today and your tests are completely normal, you will receive your results only by: [place] (if you have MyChart) OR A paper copy in the mail If you have any lab test that is abnormal or we need to change your treatment, we will call you to review the results.  Testing/Procedures: Your physician has requested that you have an echocardiogram. Echocardiography is a painless test that uses sound waves to create images of your heart. It provides your doctor with information about the size and shape of your heart and how well your hearts chambers and valves are working. This procedure takes approximately one hour. There are no restrictions for this procedure.   Cardiac-OB patient: to be performed by [place] or [place].   ZIO XT- Long Term Monitor Instructions  Your physician has requested you wear  a ZIO patch monitor for 7 days.  This is a single patch monitor. [employer] supplies one patch monitor per enrollment. Additional stickers are not available. Please do not apply patch if you will be having a Nuclear Stress Test,  Echocardiogram, Cardiac CT, MRI, or Chest Xray during the period you would be wearing the  monitor. The patch cannot be worn during these tests. You cannot remove and re-apply the  ZIO XT patch monitor.  Your ZIO patch monitor will be mailed 3 day USPS to your address on file. It may take 3-5 days  to receive your monitor after you have been enrolled.  Once you have received your monitor, please review the enclosed instructions. Your monitor  has already been registered assigning a specific monitor serial # to you.  Billing and Patient Assistance Program Information  We have supplied [employer] with any of your insurance information on file for billing purposes. [employer] offers a sliding scale Patient Assistance Program for patients that do not have  insurance, or whose insurance does not completely cover the cost of the ZIO monitor.  You must apply for the Patient Assistance Program to qualify for this discounted rate.  To apply, please call [employer] at [phone], select option 4, select option 2, ask to apply for  Patient Assistance Program. Theodore Demark will ask your household income, and how many people  are in your household. They will quote your out-of-pocket cost based on that information.  [employer] will also be able to set up a 48-month interest-free payment plan if needed.  Applying the monitor   Shave hair from upper left chest.  Hold abrader disc by orange tab. Rub abrader in 40 strokes over the upper left chest as  indicated in your monitor instructions.  Clean area with 4 enclosed alcohol pads. Let dry.  Apply patch as indicated in monitor instructions. Patch will be placed under collarbone on left  side of chest with arrow pointing upward.  Rub patch  adhesive wings for 2 minutes. Remove white label marked "1". Remove the white  label marked "2". Rub patch adhesive wings for 2 additional minutes.  While looking in a mirror, press and release button in center of patch. A small green light will  flash 3-4 times. This will be your only indicator that the monitor has been turned on.  Do not shower for the first 24 hours. You may shower after the first 24 hours.  Press the button if you feel a symptom. You will hear a small click. Record Date, Time and  Symptom in the Patient Logbook.  When you are ready to remove the patch, follow instructions on the last 2 pages of Patient  Logbook. Stick patch monitor onto the last page of Patient Logbook.  Place Patient Logbook in the blue and white box. Use locking tab on box and tape box closed  securely. The blue and white box has prepaid postage on it. Please place it in the mailbox as  soon as possible. Your physician should have your test results approximately 7 days after the  monitor has been mailed back to [facility].  Call [facility] at [phone] if you have questions regarding  your ZIO XT patch monitor. Call them immediately if you see an orange light blinking on your  monitor.  If your monitor falls off in less than 4 days, contact our Monitor department at [phone].  If your monitor becomes loose or falls off after 4 days call [employer] at [phone] for  suggestions on securing your monitor  Follow-Up: At [facility], you and your health needs are our priority.  As part of our continuing mission to provide you with exceptional heart care, we have created designated Provider Care Teams.  These Care Teams include your primary Cardiologist (physician) and Advanced Practice Providers (APPs -  Physician Assistants and Nurse Practitioners) who all work together to provide you with the care you need, when you need it.  We recommend signing up for the patient portal  called "MyChart".  Sign up information is provided on this After Visit Summary.  MyChart is used to connect with patients for Virtual Visits (Telemedicine).  Patients are able to view lab/test results, encounter notes, upcoming appointments, etc.  Non-urgent messages can be sent to your provider as well.   To learn more about what you can do with MyChart, go to [email or web address].    Your next appointment:   12 week(s)  The format for your next appointment:   In Person  Provider:   Berniece Salines, DO [address], [place],  [address]  Other Instructions    Dispo:  No follow-ups on file.   Medication Adjustments/Labs and Tests Ordered: Current medicines are reviewed at length with the patient today.  Concerns regarding medicines are outlined above.  Tests Ordered: Orders Placed This Encounter  Procedures   LONG TERM MONITOR-LIVE TELEMETRY (3-14 DAYS)   ECHOCARDIOGRAM COMPLETE   Medication Changes: No orders of the defined types were placed in this encounter.

## 2021-09-19 ENCOUNTER — Other Ambulatory Visit: Payer: Self-pay

## 2021-09-19 ENCOUNTER — Other Ambulatory Visit (HOSPITAL_COMMUNITY): Payer: Medicaid Other

## 2021-09-19 ENCOUNTER — Encounter (HOSPITAL_COMMUNITY): Payer: Self-pay | Admitting: Cardiology

## 2021-09-19 ENCOUNTER — Encounter (HOSPITAL_COMMUNITY): Payer: Self-pay

## 2021-09-19 DIAGNOSIS — R000 Tachycardia, unspecified: Secondary | ICD-10-CM

## 2021-09-19 NOTE — Patient Instructions (Signed)
JANAISHA TOLSMA  09/19/2021   Your procedure is scheduled on:  09/26/2021  Arrive at 0730 at [employer] [facility] on [address] at [facility]  and [employer]. You are invited to use the FREE valet parking or use the Visitor's parking deck.  Pick up the phone at the desk and dial [phone].  Call this number if you have problems the morning of surgery: [phone]  Remember:   Do not eat food:(After Midnight) [place].  Do not drink clear liquids: (After Midnight) [place].  Take these medicines the morning of surgery with A SIP OF WATER:  none   Do not wear jewelry, make-up or nail polish.  Do not wear lotions, powders, or perfumes. Do not wear deodorant.  Do not shave 48 hours prior to surgery.  Do not bring valuables to the hospital.  [facility] is not   responsible for any belongings or valuables brought to the hospital.  Contacts, dentures or bridgework may not be worn into surgery.  Leave suitcase in the car. After surgery it may be brought to your room.  For patients admitted to the hospital, checkout time is 11:00 AM the day of              discharge.      Please read over the following fact sheets that you were given:     Preparing for Surgery

## 2021-09-19 NOTE — Addendum Note (Signed)
Addended by: Faythe Casa on: 09/19/2021 11:01 AM   Modules accepted: Orders

## 2021-09-19 NOTE — Progress Notes (Addendum)
Received message from Loma Newton, CNM to change referral order to [facility] to urgent.  Referral placed as urgent, as pt has had her appt on 09/13/21.    Leonette Nutting  09/19/21

## 2021-09-20 ENCOUNTER — Encounter (HOSPITAL_COMMUNITY): Payer: Self-pay

## 2021-09-20 ENCOUNTER — Telehealth: Payer: Self-pay

## 2021-09-20 NOTE — Telephone Encounter (Signed)
Called pt to see why she missed her echo yesterday. She states "I thought they were going to call me. I didn't know I was an actual appointment." I reached out to Edmon Crape. Pt added to schedule to have her echo done Thursday Jan 12th at 9:15am. She states she will be there, she was very apologetic about missing the appointment yesterday. Will send a MyChart message to pt with the appointment information. She verbalized understanding and thanked me for calling.

## 2021-09-22 ENCOUNTER — Ambulatory Visit (HOSPITAL_COMMUNITY): Payer: Medicaid Other | Attending: Cardiovascular Disease

## 2021-09-22 ENCOUNTER — Other Ambulatory Visit: Payer: Self-pay

## 2021-09-22 ENCOUNTER — Encounter (HOSPITAL_COMMUNITY): Payer: Self-pay | Admitting: Family Medicine

## 2021-09-22 DIAGNOSIS — R0602 Shortness of breath: Secondary | ICD-10-CM | POA: Insufficient documentation

## 2021-09-22 LAB — ECHOCARDIOGRAM COMPLETE
Area-P 1/2: 5.27 cm2
S' Lateral: 2.7 cm

## 2021-09-23 ENCOUNTER — Other Ambulatory Visit: Payer: Self-pay | Admitting: Family Medicine

## 2021-09-23 ENCOUNTER — Other Ambulatory Visit (HOSPITAL_COMMUNITY)
Admission: RE | Admit: 2021-09-23 | Discharge: 2021-09-23 | Disposition: A | Discharge status: Home / Self Care | Payer: Medicaid Other | Source: Ambulatory Visit | Attending: Family Medicine | Admitting: Family Medicine

## 2021-09-23 DIAGNOSIS — Z3480 Encounter for supervision of other normal pregnancy, unspecified trimester: Secondary | ICD-10-CM | POA: Insufficient documentation

## 2021-09-23 DIAGNOSIS — Z20822 Contact with and (suspected) exposure to covid-19: Secondary | ICD-10-CM | POA: Insufficient documentation

## 2021-09-23 DIAGNOSIS — Z98891 History of uterine scar from previous surgery: Secondary | ICD-10-CM | POA: Diagnosis not present

## 2021-09-23 DIAGNOSIS — Z3A00 Weeks of gestation of pregnancy not specified: Secondary | ICD-10-CM | POA: Diagnosis not present

## 2021-09-23 HISTORY — DX: Anemia, unspecified: D64.9

## 2021-09-23 HISTORY — DX: Encounter for other specified aftercare: Z51.89

## 2021-09-23 HISTORY — DX: Cardiac arrhythmia, unspecified: I49.9

## 2021-09-23 HISTORY — DX: Gestational diabetes mellitus in pregnancy, unspecified control: O24.419

## 2021-09-23 HISTORY — DX: Family history of other specified conditions: Z84.89

## 2021-09-23 LAB — CBC
HCT: 33.7 % — ABNORMAL LOW (ref 36.0–46.0)
Hemoglobin: 10.8 g/dL — ABNORMAL LOW (ref 12.0–15.0)
MCH: 25.3 pg — ABNORMAL LOW (ref 26.0–34.0)
MCHC: 32 g/dL (ref 30.0–36.0)
MCV: 78.9 fL — ABNORMAL LOW (ref 80.0–100.0)
Platelets: 284 10*3/uL (ref 150–400)
RBC: 4.27 MIL/uL (ref 3.87–5.11)
RDW: 15.2 % (ref 11.5–15.5)
WBC: 8.8 10*3/uL (ref 4.0–10.5)
nRBC: 0 % (ref 0.0–0.2)

## 2021-09-23 LAB — COMPREHENSIVE METABOLIC PANEL
ALT: 8 U/L (ref 0–44)
AST: 14 U/L — ABNORMAL LOW (ref 15–41)
Albumin: 2.5 g/dL — ABNORMAL LOW (ref 3.5–5.0)
Alkaline Phosphatase: 133 U/L — ABNORMAL HIGH (ref 38–126)
Anion gap: 10 (ref 5–15)
BUN: <5 mg/dL — ABNORMAL LOW (ref 6–20)
CO2: 18 mmol/L — ABNORMAL LOW (ref 22–32)
Calcium: 8.9 mg/dL (ref 8.9–10.3)
Chloride: 108 mmol/L (ref 98–111)
Creatinine, Ser: 0.51 mg/dL (ref 0.44–1.00)
GFR, Estimated: >60 mL/min (ref >60)
Glucose, Bld: 128 mg/dL — ABNORMAL HIGH (ref 70–99)
Potassium: 3.5 mmol/L (ref 3.5–5.1)
Sodium: 136 mmol/L (ref 135–145)
Total Bilirubin: 0.3 mg/dL (ref 0.3–1.2)
Total Protein: 6.1 g/dL — ABNORMAL LOW (ref 6.5–8.1)

## 2021-09-23 LAB — TYPE AND SCREEN
ABO/RH(D): A POS
Antibody Screen: NEGATIVE

## 2021-09-23 LAB — SARS CORONAVIRUS 2 (TAT 6-24 HRS): SARS Coronavirus 2: NEGATIVE

## 2021-09-24 LAB — RPR: RPR Ser Ql: NONREACTIVE

## 2021-09-25 NOTE — H&P (Signed)
Gloria Berg is a 26 y.o. female G2P1001 with IUP at 66w6dby LMP presenting for scheduled repeat cesarean section. She reports +FMs, no LOF, no VB, no blurry vision, headaches, peripheral edema, or RUQ pain.  She plans on breast feeding. She requests POPs for birth control postpartum.  [facility]   Dating: By LMP --->  Estimated Date of Delivery: 10/03/21  Sono:   '@[redacted]w[redacted]d' , CWD, normal anatomy, cephalic presentation, posterior fundal placental lie, 2534 g, 89% EFW  Prenatal History/Complications:  Hx of cesarean section [place] spotted fever in pregnancy Gestational diabetes (diet controlled)  Past Medical History: Past Medical History:  Diagnosis Date   Anemia    Asthma    child hood-no medications now   Blood transfusion without reported diagnosis    after delivery due to anemia   Cesarean delivery delivered 10/18/2018   Depression    Dysrhythmia    tachycardia   Family history of adverse reaction to anesthesia    Grandmother nausea and vomitting   GERD (gastroesophageal reflux disease)    Gestational diabetes    Headache    Neuromuscular disorder (HClarks    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   Urinary tract infection     Past Surgical History: Past Surgical History:  Procedure Laterality Date   CESAREAN SECTION N/A 10/18/2018   Procedure: CESAREAN SECTION;  Surgeon: RDelsa Bern MD;  [place] [facility]  Service: Obstetrics;  Laterality: N/A;    Obstetrical History: OB History     Gravida  2   Para  1   Term  1   Preterm      AB      Living  1      SAB      IAB      Ectopic      Multiple  0   Live Births  1           Social History Social History   Socioeconomic History   Marital status: Single    Spouse name: Not on file   Number of children: 1   Years of education: Not on file   Highest education level: High school graduate   Occupational History   Not on file  Tobacco Use   Smoking status: Former    Types: Cigarettes    Quit date: 01/26/2021    Years since quitting: 0.6    Passive exposure: Never   Smokeless tobacco: Never  Vaping Use   Vaping Use: Never used  Substance and Sexual Activity   Alcohol use: No    Comment: 1x June 2013   Drug use: Yes    Types: Marijuana    Comment: last use was july 2022   Sexual activity: Yes    Birth control/protection: None  Other Topics Concern   Not on file  Social History Narrative   Not on file   Social Determinants of Health   Financial Resource Strain: Not on file  Food Insecurity: No Food Insecurity   Worried About RCharity fundraiserin the Last Year: Never true   Ran Out of Food in the Last Year: Never true  Transportation Needs: No Transportation Needs   Lack of Transportation (Medical): No   Lack of Transportation (Non-Medical): No  Physical Activity: Not on file  Stress: Not on file  Social Connections: Not on file    Family History: Family History  Problem Relation Age of  Onset   Drug abuse Mother    Anxiety disorder Father        takes medication for   Diabetes Maternal Grandfather    Drug abuse Maternal Aunt    Drug abuse Maternal Uncle     Allergies: No Known Allergies  Medications Prior to Admission  Medication Sig Dispense Refill Last Dose   ondansetron (ZOFRAN ODT) 4 MG disintegrating tablet Take 1 tablet (4 mg total) by mouth every 8 (eight) hours as needed for nausea or vomiting. 30 tablet 2 09/25/2021   Prenatal 27-1 MG TABS Take 1 tablet by mouth daily at 6 (six) AM. 30 tablet 12 Past Week   Blood Glucose Monitoring Suppl (ACCU-CHEK GUIDE) w/Device KIT 1 kit by Does not apply route in the morning, at noon, in the evening, and at bedtime. 1 kit 0      Review of Systems  All systems reviewed and negative except as stated in HPI  Blood pressure 118/76, pulse (!) 121, temperature (!) 97.4 F (36.3 C), temperature source  Oral, resp. rate 18, height '5\' 3"'  (1.6 m), weight 89.4 kg, last menstrual period 12/27/2020, SpO2 98 %, unknown if currently breastfeeding.  General appearance: alert, cooperative, and no distress Lungs: normal work of breathing on room air  Heart: normal rate, warm and well perfused  Abdomen: soft, non-tender, gravid  Extremities: no LE edema or calf tenderness to palpation   Prenatal labs: ABO, Rh: --/--/A POS (01/13 5053) Antibody: NEG (01/13 0939) Rubella: 1.93 (08/05 1140) RPR: NON REACTIVE (01/13 0934)  HBsAg: NON REACTIVE (08/05 1040)  HIV: Non Reactive (11/04 0838)  GBS:  Not done   2 hr Glucola - Abnormal  Genetic screening - Not done  Anatomy US normal   Prenatal Transfer Tool  Maternal Diabetes: Yes:  Diabetes Type:  Diet controlled Genetic Screening: Not done  Maternal Ultrasounds/Referrals: Normal Fetal Ultrasounds or other Referrals:  None Maternal Substance Abuse:  Yes:  Type: Marijuana (last used one month ago) Significant Maternal Medications:  None Significant Maternal Lab Results: None  Results for orders placed or performed during the hospital encounter of 09/26/21 (from the past 24 hour(s))  Glucose, capillary   Collection Time: 09/26/21  7:55 AM  Result Value Ref Range   Glucose-Capillary 111 (H) 70 - 99 mg/dL    Patient Active Problem List   Diagnosis Date Noted   Gestational diabetes mellitus (GDM) 08/31/2021   LGSIL on Pap smear of cervix 06/15/2021   [facility] spotted fever 06/15/2021   History of cesarean section 04/15/2021   Supervision of other normal pregnancy, antepartum 03/29/2021   Marijuana use 03/29/2021   Nausea and vomiting of pregnancy, antepartum 03/29/2021    Assessment/Plan:  Gloria Berg is a 26 y.o. G2P1001 at 74w6dhere for scheduled repeat cesarean section.   The risks of surgery were discussed with the patient including but were not limited to: bleeding which may require transfusion or reoperation; infection  which may require antibiotics; injury to bowel, bladder, ureters or other surrounding organs; injury to the fetus; need for additional procedures including hysterectomy in the event of a life-threatening hemorrhage; formation of adhesions; placental abnormalities with subsequent pregnancies; incisional problems; thromboembolic phenomenon and other postoperative/anesthesia complications.  The patient concurred with the proposed plan, giving informed written consent for the procedure.  Patient has been NPO since 10 PM last evening and she will remain NPO for procedure. Anesthesia and OR aware. Preoperative prophylactic antibiotics and SCDs ordered on call to the OR.  To OR when ready.  #Pain: Per anesthesia  #ID:  Pre-operative antibiotics ordered  #MOF: Breast  #MOC: POPs #Circ:  Desires   #A1GDM: Plan for fasting CBG on POD#1.  Genia Del, MD  09/26/2021, 8:37 AM

## 2021-09-26 ENCOUNTER — Inpatient Hospital Stay (HOSPITAL_COMMUNITY)
Admission: RE | Admit: 2021-09-26 | Discharge: 2021-09-28 | DRG: 787 | Disposition: A | Discharge status: Home / Self Care | Payer: Medicaid Other | Attending: Family Medicine | Admitting: Family Medicine

## 2021-09-26 ENCOUNTER — Inpatient Hospital Stay (HOSPITAL_COMMUNITY): Payer: Medicaid Other | Admitting: Anesthesiology

## 2021-09-26 ENCOUNTER — Encounter (HOSPITAL_COMMUNITY)
Admission: RE | Disposition: A | Discharge status: Home / Self Care | Payer: Self-pay | Source: Home / Self Care | Attending: Family Medicine

## 2021-09-26 ENCOUNTER — Other Ambulatory Visit: Payer: Self-pay

## 2021-09-26 ENCOUNTER — Encounter (HOSPITAL_COMMUNITY): Payer: Self-pay | Admitting: Family Medicine

## 2021-09-26 DIAGNOSIS — Z3480 Encounter for supervision of other normal pregnancy, unspecified trimester: Secondary | ICD-10-CM

## 2021-09-26 DIAGNOSIS — O24419 Gestational diabetes mellitus in pregnancy, unspecified control: Secondary | ICD-10-CM | POA: Diagnosis present

## 2021-09-26 DIAGNOSIS — Z3A38 38 weeks gestation of pregnancy: Secondary | ICD-10-CM

## 2021-09-26 DIAGNOSIS — O9081 Anemia of the puerperium: Secondary | ICD-10-CM | POA: Diagnosis not present

## 2021-09-26 DIAGNOSIS — O24420 Gestational diabetes mellitus in childbirth, diet controlled: Secondary | ICD-10-CM | POA: Diagnosis not present

## 2021-09-26 DIAGNOSIS — Z87891 Personal history of nicotine dependence: Secondary | ICD-10-CM | POA: Diagnosis not present

## 2021-09-26 DIAGNOSIS — O99324 Drug use complicating childbirth: Secondary | ICD-10-CM | POA: Diagnosis present

## 2021-09-26 DIAGNOSIS — O34211 Maternal care for low transverse scar from previous cesarean delivery: Secondary | ICD-10-CM | POA: Diagnosis not present

## 2021-09-26 DIAGNOSIS — Z98891 History of uterine scar from previous surgery: Secondary | ICD-10-CM

## 2021-09-26 DIAGNOSIS — F1290 Cannabis use, unspecified, uncomplicated: Secondary | ICD-10-CM | POA: Diagnosis present

## 2021-09-26 DIAGNOSIS — R87612 Low grade squamous intraepithelial lesion on cytologic smear of cervix (LGSIL): Secondary | ICD-10-CM | POA: Diagnosis present

## 2021-09-26 DIAGNOSIS — D62 Acute posthemorrhagic anemia: Secondary | ICD-10-CM | POA: Diagnosis not present

## 2021-09-26 DIAGNOSIS — Z3A39 39 weeks gestation of pregnancy: Secondary | ICD-10-CM | POA: Diagnosis not present

## 2021-09-26 DIAGNOSIS — O2492 Unspecified diabetes mellitus in childbirth: Secondary | ICD-10-CM | POA: Diagnosis not present

## 2021-09-26 LAB — GLUCOSE, CAPILLARY
Glucose-Capillary: 111 mg/dL — ABNORMAL HIGH (ref 70–99)
Glucose-Capillary: 102 mg/dL — ABNORMAL HIGH (ref 70–99)

## 2021-09-26 SURGERY — Surgical Case
Anesthesia: Spinal

## 2021-09-26 MED ORDER — MORPHINE SULFATE (PF) 0.5 MG/ML IJ SOLN
INTRAMUSCULAR | Status: DC | PRN
  Administered 2021-09-26: 150 ug via INTRATHECAL

## 2021-09-26 MED ORDER — FENTANYL CITRATE (PF) 100 MCG/2ML IJ SOLN
INTRAMUSCULAR | Status: AC
  Filled 2021-09-26: qty 2

## 2021-09-26 MED ORDER — BUPIVACAINE IN DEXTROSE 0.75-8.25 % IT SOLN
INTRATHECAL | Status: DC | PRN
  Administered 2021-09-26: 1.6 mL via INTRATHECAL

## 2021-09-26 MED ORDER — KETOROLAC TROMETHAMINE 30 MG/ML IJ SOLN: 30.0000 mg | Freq: Four times a day (QID) | INTRAMUSCULAR | Status: AC | PRN

## 2021-09-26 MED ORDER — DIPHENHYDRAMINE HCL 50 MG/ML IJ SOLN: 12.5000 mg | INTRAMUSCULAR | Status: DC | PRN

## 2021-09-26 MED ORDER — DIPHENHYDRAMINE HCL 25 MG PO CAPS: 25.0000 mg | ORAL_CAPSULE | Freq: Four times a day (QID) | ORAL | Status: DC | PRN

## 2021-09-26 MED ORDER — ACETAMINOPHEN 10 MG/ML IV SOLN: 1000.0000 mg | Freq: Once | INTRAVENOUS | Status: DC | PRN

## 2021-09-26 MED ORDER — COCONUT OIL OIL: 1.0000 "application " | TOPICAL_OIL | Status: DC | PRN

## 2021-09-26 MED ORDER — LACTATED RINGERS IV SOLN: INTRAVENOUS | Status: DC

## 2021-09-26 MED ORDER — MORPHINE SULFATE (PF) 0.5 MG/ML IJ SOLN
INTRAMUSCULAR | Status: AC
  Filled 2021-09-26: qty 10

## 2021-09-26 MED ORDER — SODIUM CHLORIDE 0.9 % IR SOLN
Status: DC | PRN
  Administered 2021-09-26: 1

## 2021-09-26 MED ORDER — CEFAZOLIN SODIUM-DEXTROSE 2-4 GM/100ML-% IV SOLN
INTRAVENOUS | Status: AC
  Filled 2021-09-26: qty 100

## 2021-09-26 MED ORDER — FENTANYL CITRATE (PF) 100 MCG/2ML IJ SOLN
INTRAMUSCULAR | Status: DC | PRN
  Administered 2021-09-26: 15 ug via INTRATHECAL

## 2021-09-26 MED ORDER — MEPERIDINE HCL 25 MG/ML IJ SOLN: 6.2500 mg | INTRAMUSCULAR | Status: DC | PRN

## 2021-09-26 MED ORDER — SOD CITRATE-CITRIC ACID 500-334 MG/5ML PO SOLN
30.0000 mL | ORAL | Status: AC
  Administered 2021-09-26: 30 mL via ORAL

## 2021-09-26 MED ORDER — SIMETHICONE 80 MG PO CHEW
80.0000 mg | CHEWABLE_TABLET | Freq: Three times a day (TID) | ORAL | Status: DC
  Administered 2021-09-27 – 2021-09-28 (×3): 80 mg via ORAL
  Filled 2021-09-26 (×3): qty 1

## 2021-09-26 MED ORDER — NALOXONE HCL 0.4 MG/ML IJ SOLN: 0.4000 mg | INTRAMUSCULAR | Status: DC | PRN

## 2021-09-26 MED ORDER — HYDROCODONE-ACETAMINOPHEN 5-325 MG PO TABS
1.0000 | ORAL_TABLET | ORAL | Status: DC | PRN
  Administered 2021-09-27 – 2021-09-28 (×3): 2 via ORAL
  Filled 2021-09-26 (×3): qty 2

## 2021-09-26 MED ORDER — WITCH HAZEL-GLYCERIN EX PADS: 1.0000 "application " | MEDICATED_PAD | CUTANEOUS | Status: DC | PRN

## 2021-09-26 MED ORDER — KETOROLAC TROMETHAMINE 30 MG/ML IJ SOLN
INTRAMUSCULAR | Status: AC
  Filled 2021-09-26: qty 1

## 2021-09-26 MED ORDER — MENTHOL 3 MG MT LOZG: 1.0000 | LOZENGE | OROMUCOSAL | Status: DC | PRN

## 2021-09-26 MED ORDER — ENOXAPARIN SODIUM 40 MG/0.4ML IJ SOSY
40.0000 mg | PREFILLED_SYRINGE | INTRAMUSCULAR | Status: DC
  Administered 2021-09-27 – 2021-09-28 (×2): 40 mg via SUBCUTANEOUS
  Filled 2021-09-26 (×2): qty 0.4

## 2021-09-26 MED ORDER — HYDROMORPHONE HCL 1 MG/ML IJ SOLN: 0.2500 mg | INTRAMUSCULAR | Status: DC | PRN

## 2021-09-26 MED ORDER — SIMETHICONE 80 MG PO CHEW: 80.0000 mg | CHEWABLE_TABLET | ORAL | Status: DC | PRN

## 2021-09-26 MED ORDER — LACTATED RINGERS IV SOLN
INTRAVENOUS | Status: DC
  Administered 2021-09-26: 125 mL/h via INTRAVENOUS

## 2021-09-26 MED ORDER — ONDANSETRON HCL 4 MG/2ML IJ SOLN
INTRAMUSCULAR | Status: AC
  Filled 2021-09-26: qty 2

## 2021-09-26 MED ORDER — PRENATAL MULTIVITAMIN CH
1.0000 | ORAL_TABLET | Freq: Every day | ORAL | Status: DC
  Administered 2021-09-27 – 2021-09-28 (×2): 1 via ORAL
  Filled 2021-09-26 (×2): qty 1

## 2021-09-26 MED ORDER — SCOPOLAMINE 1 MG/3DAYS TD PT72: 1.0000 | MEDICATED_PATCH | Freq: Once | TRANSDERMAL | Status: DC

## 2021-09-26 MED ORDER — ONDANSETRON HCL 4 MG/2ML IJ SOLN
INTRAMUSCULAR | Status: DC | PRN
  Administered 2021-09-26: 4 mg via INTRAVENOUS

## 2021-09-26 MED ORDER — PHENYLEPHRINE HCL-NACL 20-0.9 MG/250ML-% IV SOLN
INTRAVENOUS | Status: AC
  Filled 2021-09-26: qty 250

## 2021-09-26 MED ORDER — KETOROLAC TROMETHAMINE 30 MG/ML IJ SOLN
30.0000 mg | Freq: Four times a day (QID) | INTRAMUSCULAR | Status: AC | PRN
  Administered 2021-09-26: 30 mg via INTRAVENOUS

## 2021-09-26 MED ORDER — KETOROLAC TROMETHAMINE 30 MG/ML IJ SOLN
30.0000 mg | Freq: Four times a day (QID) | INTRAMUSCULAR | Status: AC
  Administered 2021-09-26 – 2021-09-27 (×4): 30 mg via INTRAVENOUS
  Filled 2021-09-26 (×4): qty 1

## 2021-09-26 MED ORDER — SOD CITRATE-CITRIC ACID 500-334 MG/5ML PO SOLN
ORAL | Status: AC
  Filled 2021-09-26: qty 30

## 2021-09-26 MED ORDER — SENNOSIDES-DOCUSATE SODIUM 8.6-50 MG PO TABS
2.0000 | ORAL_TABLET | ORAL | Status: DC
  Filled 2021-09-26: qty 2

## 2021-09-26 MED ORDER — PROMETHAZINE HCL 25 MG/ML IJ SOLN: 6.2500 mg | INTRAMUSCULAR | Status: DC | PRN

## 2021-09-26 MED ORDER — OXYCODONE HCL 5 MG/5ML PO SOLN: 5.0000 mg | Freq: Once | ORAL | Status: DC | PRN

## 2021-09-26 MED ORDER — DIPHENHYDRAMINE HCL 25 MG PO CAPS: 25.0000 mg | ORAL_CAPSULE | ORAL | Status: DC | PRN

## 2021-09-26 MED ORDER — STERILE WATER FOR IRRIGATION IR SOLN
Status: DC | PRN
  Administered 2021-09-26: 1

## 2021-09-26 MED ORDER — POVIDONE-IODINE 10 % EX SWAB
2.0000 "application " | Freq: Once | CUTANEOUS | Status: AC
  Administered 2021-09-26: 2 via TOPICAL

## 2021-09-26 MED ORDER — NALOXONE HCL 4 MG/10ML IJ SOLN
1.0000 ug/kg/h | INTRAVENOUS | Status: DC | PRN
  Filled 2021-09-26: qty 5

## 2021-09-26 MED ORDER — OXYTOCIN-SODIUM CHLORIDE 30-0.9 UT/500ML-% IV SOLN: 2.5000 [IU]/h | INTRAVENOUS | Status: AC

## 2021-09-26 MED ORDER — DIBUCAINE (PERIANAL) 1 % EX OINT: 1.0000 "application " | TOPICAL_OINTMENT | CUTANEOUS | Status: DC | PRN

## 2021-09-26 MED ORDER — EPHEDRINE SULFATE-NACL 50-0.9 MG/10ML-% IV SOSY: PREFILLED_SYRINGE | INTRAVENOUS | Status: DC | PRN

## 2021-09-26 MED ORDER — SODIUM CHLORIDE 0.9% FLUSH: 3.0000 mL | INTRAVENOUS | Status: DC | PRN

## 2021-09-26 MED ORDER — PHENYLEPHRINE HCL-NACL 20-0.9 MG/250ML-% IV SOLN
INTRAVENOUS | Status: DC | PRN
  Administered 2021-09-26: 60 ug/min via INTRAVENOUS

## 2021-09-26 MED ORDER — IBUPROFEN 600 MG PO TABS
600.0000 mg | ORAL_TABLET | Freq: Four times a day (QID) | ORAL | Status: DC
  Administered 2021-09-27 – 2021-09-28 (×4): 600 mg via ORAL
  Filled 2021-09-26 (×4): qty 1

## 2021-09-26 MED ORDER — CEFAZOLIN SODIUM-DEXTROSE 2-4 GM/100ML-% IV SOLN
2.0000 g | INTRAVENOUS | Status: AC
  Administered 2021-09-26: 2 g via INTRAVENOUS

## 2021-09-26 MED ORDER — OXYTOCIN-SODIUM CHLORIDE 30-0.9 UT/500ML-% IV SOLN
INTRAVENOUS | Status: DC | PRN
  Administered 2021-09-26: 400 mL via INTRAVENOUS

## 2021-09-26 MED ORDER — OXYCODONE HCL 5 MG PO TABS: 5.0000 mg | ORAL_TABLET | Freq: Once | ORAL | Status: DC | PRN

## 2021-09-26 MED ORDER — OXYTOCIN-SODIUM CHLORIDE 30-0.9 UT/500ML-% IV SOLN
INTRAVENOUS | Status: AC
  Filled 2021-09-26: qty 500

## 2021-09-26 MED ORDER — ONDANSETRON HCL 4 MG/2ML IJ SOLN
4.0000 mg | Freq: Three times a day (TID) | INTRAMUSCULAR | Status: DC | PRN
  Administered 2021-09-26: 4 mg via INTRAVENOUS
  Filled 2021-09-26: qty 2

## 2021-09-26 SURGICAL SUPPLY — 37 items
APL SKNCLS STERI-STRIP NONHPOA (GAUZE/BANDAGES/DRESSINGS) ×1
BENZOIN TINCTURE PRP APPL 2/3 (GAUZE/BANDAGES/DRESSINGS) ×1 IMPLANT
CHLORAPREP W/TINT 26ML (MISCELLANEOUS) ×2 IMPLANT
CLAMP CORD UMBIL (MISCELLANEOUS) IMPLANT
CLOTH BEACON ORANGE TIMEOUT ST (SAFETY) ×2 IMPLANT
DRSG OPSITE POSTOP 4X10 (GAUZE/BANDAGES/DRESSINGS) ×2 IMPLANT
ELECT REM PT RETURN 9FT ADLT (ELECTROSURGICAL) ×2
ELECTRODE REM PT RTRN 9FT ADLT (ELECTROSURGICAL) ×1 IMPLANT
EXTRACTOR VACUUM BELL STYLE (SUCTIONS) IMPLANT
GLOVE BIOGEL PI IND STRL 7.0 (GLOVE) ×2 IMPLANT
GLOVE BIOGEL PI INDICATOR 7.0 (GLOVE) ×2
GLOVE ECLIPSE 7.0 STRL STRAW (GLOVE) ×2 IMPLANT
GOWN STRL REUS W/TWL LRG LVL3 (GOWN DISPOSABLE) ×4 IMPLANT
HEMOSTAT ARISTA ABSORB 3G PWDR (HEMOSTASIS) ×1 IMPLANT
KIT ABG SYR 3ML LUER SLIP (SYRINGE) ×2 IMPLANT
NDL HYPO 25X5/8 SAFETYGLIDE (NEEDLE) ×1 IMPLANT
NEEDLE HYPO 25X5/8 SAFETYGLIDE (NEEDLE) ×2 IMPLANT
NS IRRIG 1000ML POUR BTL (IV SOLUTION) ×2 IMPLANT
PACK C SECTION WH (CUSTOM PROCEDURE TRAY) ×2 IMPLANT
PAD OB MATERNITY 4.3X12.25 (PERSONAL CARE ITEMS) ×2 IMPLANT
PENCIL SMOKE EVAC W/HOLSTER (ELECTROSURGICAL) ×2 IMPLANT
RTRCTR C-SECT PINK 25CM LRG (MISCELLANEOUS) ×2 IMPLANT
STRIP SURGICAL 1/2 X 6 IN (GAUZE/BANDAGES/DRESSINGS) ×1 IMPLANT
SUT MNCRL 0 VIOLET CTX 36 (SUTURE) ×2 IMPLANT
SUT MONOCRYL 0 CTX 36 (SUTURE) ×6
SUT PLAIN 0 NONE (SUTURE) IMPLANT
SUT PLAIN 2 0 (SUTURE)
SUT PLAIN 2 0 XLH (SUTURE) ×1 IMPLANT
SUT PLAIN ABS 2-0 CT1 27XMFL (SUTURE) IMPLANT
SUT VIC AB 0 CTX 36 (SUTURE) ×2
SUT VIC AB 0 CTX36XBRD ANBCTRL (SUTURE) ×1 IMPLANT
SUT VIC AB 2-0 CT1 27 (SUTURE) ×2
SUT VIC AB 2-0 CT1 TAPERPNT 27 (SUTURE) ×1 IMPLANT
SUT VIC AB 4-0 KS 27 (SUTURE) ×2 IMPLANT
TOWEL OR 17X24 6PK STRL BLUE (TOWEL DISPOSABLE) ×2 IMPLANT
TRAY FOLEY W/BAG SLVR 14FR LF (SET/KITS/TRAYS/PACK) IMPLANT
WATER STERILE IRR 1000ML POUR (IV SOLUTION) ×2 IMPLANT

## 2021-09-26 NOTE — Anesthesia Preprocedure Evaluation (Signed)
Anesthesia Evaluation  Patient identified by MRN, date of birth, ID band Patient awake    Reviewed: Allergy & Precautions, H&P , NPO status , Patient's Chart, lab work & pertinent test results  History of Anesthesia Complications Negative for: history of anesthetic complications  Airway Mallampati: II  TM Distance: >3 FB     Dental   Pulmonary asthma (childhood) , former smoker,    Pulmonary exam normal        Cardiovascular negative cardio ROS   Rhythm:regular Rate:Normal     Neuro/Psych negative neurological ROS  negative psych ROS   GI/Hepatic Neg liver ROS, GERD  ,  Endo/Other  negative endocrine ROSdiabetes  Renal/GU negative Renal ROS  negative genitourinary   Musculoskeletal   Abdominal   Peds  Hematology negative hematology ROS (+)   Anesthesia Other Findings   Reproductive/Obstetrics (+) Pregnancy G2P1001 at [redacted]w[redacted]d Prior C/S x1                             Anesthesia Physical Anesthesia Plan  ASA: 2  Anesthesia Plan: Spinal   Post-op Pain Management:    Induction:   PONV Risk Score and Plan: Ondansetron and Treatment may vary due to age or medical condition  Airway Management Planned:   Additional Equipment:   Intra-op Plan:   Post-operative Plan:   Informed Consent: I have reviewed the patients History and Physical, chart, labs and discussed the procedure including the risks, benefits and alternatives for the proposed anesthesia with the patient or authorized representative who has indicated his/her understanding and acceptance.       Plan Discussed with: Anesthesiologist  Anesthesia Plan Comments:         Anesthesia Quick Evaluation

## 2021-09-26 NOTE — Transfer of Care (Signed)
Immediate Anesthesia Transfer of Care Note  Patient: Gloria Berg  Procedure(s) Performed: CESAREAN SECTION  Patient Location: PACU  Anesthesia Type:Spinal  Level of Consciousness: awake  Airway & Oxygen Therapy: Patient Spontanous Breathing  Post-op Assessment: Report given to RN and Post -op Vital signs reviewed and stable  Post vital signs: Reviewed and stable  Last Vitals:  Vitals Value Taken Time  BP 103/57 09/26/21 1105  Temp    Pulse 86 09/26/21 1108  Resp 19 09/26/21 1108  SpO2 100 % 09/26/21 1108  Vitals shown include unvalidated device data.  Last Pain:  Vitals:   09/26/21 0750  TempSrc: Oral         Complications: No notable events documented.

## 2021-09-26 NOTE — Op Note (Signed)
Operative Note   Patient: Gloria Berg  Date of Procedure: 09/26/2021  Procedure: Repeat Low Transverse Cesarean   Indications: previous uterine incision: low transverse  Pre-operative Diagnosis: Repeat Cesarean Section.   Post-operative Diagnosis: Same  TOLAC Candidate: No  Surgeon: Surgeon(s) and Role:    * Kellsey Sansone, Mary Sella, MD - Primary    * Worthy Rancher, MD - Assisting  Assistants: none  An experienced assistant was required given the standard of surgical care given the complexity of the case.  This assistant was needed for exposure, dissection, suctioning, retraction, instrument exchange, assisting with delivery with administration of fundal pressure, and for overall help during the procedure.   Anesthesia: spinal  Anesthesiologist: No responsible provider has been recorded for the case.   Antibiotics: Cefazolin   Estimated Blood Loss: 650 ml   Total IV Fluids: 3100 ml  Urine Output:  450 cc OF clear urine  Specimens: none   Complications: no complications   Indications: Gloria Berg is a 26 y.o. [phone] with an IUP [redacted]w[redacted]d presenting for scheduled cesarean secondary to the indications listed above.  The risks of cesarean section discussed with the patient included but were not limited to: bleeding which may require transfusion or reoperation; infection which may require antibiotics; injury to bowel, bladder, ureters or other surrounding organs; injury to the fetus; need for additional procedures including hysterectomy in the event of a life-threatening hemorrhage; placental abnormalities with subsequent pregnancies, incisional problems, thromboembolic phenomenon and other postoperative/anesthesia complications. The patient concurred with the proposed plan, giving informed written consent for the procedure. Patient has been NPO since last night she will remain NPO for procedure. Anesthesia and OR aware. Preoperative prophylactic antibiotics and SCDs  ordered on call to the OR.   Findings: Viable infant in cephalic presentation, body cord x1. Apgars 9, 9. Weight 3950 g . Clear amniotic fluid. Normal placenta, three vessel cord. Normal uterus, Normal bilateral fallopian tubes, Normal bilateral ovaries.  Procedure Details: A Time Out was held and the above information confirmed. The patient received intravenous antibiotics and had sequential compression devices applied to her lower extremities preoperatively. The patient was taken back to the operative suite where spinal anesthesia was administered. After induction of anesthesia, the patient was draped and prepped in the usual sterile manner and placed in a dorsal supine position with a leftward tilt. A low transverse skin incision was made with scalpel and carried down through the subcutaneous tissue to the fascia. Fascial incision was made and extended transversely. The fascia was separated from the underlying rectus tissue superiorly and inferiorly. The rectus muscles were separated in the midline bluntly and the peritoneum was entered bluntly. An Alexis retractor was placed to aid in visualization of the uterus. Due to bladder being adhered quite high on the lower uterine segment, the utero-vesical peritoneal reflection was incised transversely and the bladder flap was bluntly freed from the lower uterine segment. A low transverse uterine incision was made. The infant was successfully delivered from cephalic presentation, the umbilical cord was clamped after 1 minute. Cord ph was not sent, and cord blood was obtained for evaluation. The placenta was removed Intact and appeared normal. The uterine incision was closed with running locked sutures of 0-Monocryl, and then a second imbricating layer was also placed with 0-Monocryl. Overall, excellent hemostasis was noted from the hysterotomy, but due to some bleeding from the serosal edge of the bladder flap a layer of Arista was placed with excellent hemostasis  noted. The abdomen and  the pelvis were cleared of all clot and debris and the Jon Gills was removed. Hemostasis was confirmed on all surfaces.  The peritoneum was reapproximated using 2-0 vicryl . The fascia was then closed using 0 Vicryl in a running fashion. The subcutaneous layer was reapproximated with plain gut and the skin was closed with a 4-0 vicryl subcuticular stitch. The patient tolerated the procedure well. Sponge, lap, instrument and needle counts were correct x 2. She was taken to the recovery room in stable condition.  Disposition: PACU - hemodynamically stable.    Signed: Venora Maples, MD, MPH [facility])

## 2021-09-26 NOTE — Anesthesia Procedure Notes (Signed)
Spinal ° °Patient location during procedure: OR °Reason for block: surgical anesthesia °Staffing °Performed: anesthesiologist  °Anesthesiologist: Idris Edmundson E, MD °Preanesthetic Checklist °Completed: patient identified, IV checked, risks and benefits discussed, surgical consent, monitors and equipment checked, pre-op evaluation and timeout performed °Spinal Block °Patient position: sitting °Prep: DuraPrep and site prepped and draped °Patient monitoring: continuous pulse ox, blood pressure and heart rate °Approach: midline °Location: L3-4 °Injection technique: single-shot °Needle °Needle type: Pencan  °Needle gauge: 24 G °Needle length: 10 cm °Assessment °Events: CSF return °Additional Notes °Functioning IV was confirmed and monitors were applied. Sterile prep and drape, including hand hygiene and sterile gloves were used. The patient was positioned and the spine was prepped. The skin was anesthetized with lidocaine.  Free flow of clear CSF was obtained prior to injecting local anesthetic into the CSF. The needle was carefully withdrawn. The patient tolerated the procedure well.  ° ° ° °

## 2021-09-26 NOTE — Discharge Summary (Signed)
Postpartum Discharge Summary     Patient Name: Gloria Berg DOB: [birth year] MRN: [id number]  Date of admission: 09/26/2021 Delivery date:09/26/2021  Delivering provider: Genia Del  Date of discharge: 09/28/2021  Admitting diagnosis: History of cesarean delivery [Z98.891] Cesarean delivery delivered [O82] Intrauterine pregnancy: [redacted]w[redacted]d    Secondary diagnosis:  Active Problems:   History of cesarean section   LGSIL on Pap smear of cervix   Gestational diabetes mellitus (GDM)   Cesarean delivery delivered  Additional problems: acute blood loss anemia POD 1, received IV Venofer    Discharge diagnosis: Term Pregnancy Delivered and GDM A1 , anemia                                             Post partum procedures: none Augmentation: N/A Complications: None  Hospital course: Sceduled C/S   26y.o. yo G2P2002 at 354w0das admitted to the hospital 09/26/2021 for scheduled cesarean section with the following indication:Elective Repeat.Delivery details are as follows:  Membrane Rupture Time/Date: 10:08 AM ,09/26/2021   Delivery Method:C-Section, Low Transverse  Details of operation can be found in separate operative note.  Patient had an uncomplicated postpartum course.  She is ambulating, tolerating a regular diet, passing flatus, and urinating well. Patient is discharged home in stable condition on  09/28/21        Newborn Data: Birth date:09/26/2021  Birth time:10:09 AM  Gender:Female  Living status:Living  Apgars:9 ,9  Weight:3950 g     Magnesium Sulfate received: No BMZ received: No Rhophylac:N/A MMR:N/A T-DaP:Given prenatally Flu: No Transfusion:No  Physical exam  Vitals:   09/27/21 0400 09/27/21 1430 09/27/21 2200 09/28/21 0500  BP: 112/70 (!) 97/58 106/70 111/69  Pulse: (!) 104 97 (!) 105 (!) 107  Resp: '18 18 18 17  ' Temp: 98.3 F (36.8 C) 98.2 F (36.8 C) 97.8 F (36.6 C) 98.1 F (36.7 C)  TempSrc: Oral Oral Axillary Oral  SpO2: 100%  100%   Weight:       Height:       General: alert, cooperative, and no distress Lochia: appropriate Uterine Fundus: firm Incision: Healing well with no significant drainage, No significant erythema, Dressing is clean, dry, and intact DVT Evaluation: No cords or calf tenderness. No significant calf/ankle edema. Labs: Lab Results  Component Value Date   WBC 9.6 09/27/2021   HGB 8.2 (L) 09/27/2021   HCT 25.6 (L) 09/27/2021   MCV 78.5 (L) 09/27/2021   PLT 225 09/27/2021   CMP Latest Ref Rng & Units 09/23/2021  Glucose 70 - 99 mg/dL 128(H)  BUN 6 - 20 mg/dL <5(L)  Creatinine 0.44 - 1.00 mg/dL 0.51  Sodium 135 - 145 mmol/L 136  Potassium 3.5 - 5.1 mmol/L 3.5  Chloride 98 - 111 mmol/L 108  CO2 22 - 32 mmol/L 18(L)  Calcium 8.9 - 10.3 mg/dL 8.9  Total Protein 6.5 - 8.1 g/dL 6.1(L)  Total Bilirubin 0.3 - 1.2 mg/dL 0.3  Alkaline Phos 38 - 126 U/L 133(H)  AST 15 - 41 U/L 14(L)  ALT 0 - 44 U/L 8   Edinburgh Score: Edinburgh Postnatal Depression Scale Screening Tool 09/28/2021  I have been able to laugh and see the funny side of things. 0  I have looked forward with enjoyment to things. 0  I have blamed myself unnecessarily when things went wrong. 2  I have been anxious  or worried for no good reason. 2  I have felt scared or panicky for no good reason. 2  Things have been getting on top of me. 1  I have been so unhappy that I have had difficulty sleeping. 1  I have felt sad or miserable. 2  I have been so unhappy that I have been crying. 1  The thought of harming myself has occurred to me. 0  Edinburgh Postnatal Depression Scale Total 11     After visit meds:  Allergies as of 09/28/2021   No Known Allergies      Medication List     STOP taking these medications    Accu-Chek Guide w/Device Kit   ondansetron 4 MG disintegrating tablet Commonly known as: Zofran ODT       TAKE these medications    HYDROcodone-acetaminophen 5-325 MG tablet Commonly known as: NORCO/VICODIN Take 1  tablet by mouth every 4 (four) hours as needed for up to 5 days for moderate pain.   ibuprofen 600 MG tablet Commonly known as: ADVIL Take 1 tablet (600 mg total) by mouth every 6 (six) hours.   norethindrone 0.35 MG tablet Commonly known as: Ortho Micronor Take 1 tablet (0.35 mg total) by mouth daily.   Prenatal 27-1 MG Tabs Take 1 tablet by mouth daily at 6 (six) AM.               Discharge Care Instructions  (From admission, onward)           Start     Ordered   09/28/21 0000  Leave dressing on - Keep it clean, dry, and intact until clinic visit        09/28/21 1136             Discharge home in stable condition Infant Feeding: Breast Infant Disposition:home with mother Discharge instruction: per After Visit Summary and Postpartum booklet. Activity: Advance as tolerated. Pelvic rest for 6 weeks.  Diet: routine diet Future Appointments: Future Appointments  Date Time Provider [place]  10/03/2021  2:30 PM [facility] NURSE [facility]  10/13/2021  2:15 PM [facility] CLINICIAN [facility]  11/07/2021  8:15 AM Virginia Rochester, NP [facility]  11/07/2021  8:50 AM [facility]  12/09/2021 10:40 AM Tobb, Godfrey Pick, DO [facility]   Follow up Visit:   Please schedule this patient for a In person postpartum visit in 6 weeks with the following provider: Any provider. Additional Postpartum F/U:2 hour GTT and Incision check 1 week  High risk pregnancy complicated by: GDM Delivery mode:  C-Section, Low Transverse  Anticipated Birth Control:  POPs   09/28/2021 Starr Lake, CNM

## 2021-09-26 NOTE — Anesthesia Postprocedure Evaluation (Signed)
Anesthesia Post Note  Patient: Gloria Berg  Procedure(s) Performed: CESAREAN SECTION     Patient location during evaluation: PACU Anesthesia Type: Spinal Level of consciousness: oriented and awake and alert Pain management: pain level controlled Vital Signs Assessment: post-procedure vital signs reviewed and stable Respiratory status: spontaneous breathing, respiratory function stable and nonlabored ventilation Cardiovascular status: blood pressure returned to baseline and stable Postop Assessment: no headache, no backache, no apparent nausea or vomiting and spinal receding Anesthetic complications: no   No notable events documented.  Last Vitals:  Vitals:   09/26/21 1219 09/26/21 1320  BP: 114/73 110/61  Pulse: 86 84  Resp: 18 20  Temp: 36.4 C 36.5 C  SpO2: 100% 99%    Last Pain:  Vitals:   09/26/21 1320  TempSrc: Oral  PainSc: 0-No pain   Pain Goal:                   Lucretia Kern

## 2021-09-26 NOTE — Lactation Note (Signed)
This note was copied from a baby's chart. Lactation Consultation Note  Patient Name: Boy Lulia Schriner [id number] Date: 09/26/2021 Reason for consult: Initial assessment Age:25 hours  P2, First child in NICU so breastfed for 2 weeks. Reviewed basics.  Answered questions about milk storage and pumping. Mom made aware of O/P services, breastfeeding support groups, community resources, and our phone # for post-discharge questions.    Maternal Data Has patient been taught Hand Expression?: Yes Does the patient have breastfeeding experience prior to this delivery?: Yes How long did the patient breastfeed?: 2 weeks (NICU/pumped)  Feeding Mother's Current Feeding Choice: Breast Milk  LATCH Score Latch: Repeated attempts needed to sustain latch, nipple held in mouth throughout feeding, stimulation needed to elicit sucking reflex.  Audible Swallowing: A few with stimulation  Type of Nipple: Everted at rest and after stimulation  Comfort (Breast/Nipple): Soft / non-tender  Hold (Positioning): Assistance needed to correctly position infant at breast and maintain latch.  LATCH Score: 7   Lactation Tools Discussed/Used    Interventions Interventions: Breast feeding basics reviewed;Education;LC Services brochure  Discharge Pump: Personal;DEBP  Consult Status Consult Status: Follow-up Date: 09/27/21 Follow-up type: In-patient    Dahlia Byes [facility] 09/26/2021, 1:04 PM

## 2021-09-27 ENCOUNTER — Encounter: Payer: Self-pay | Admitting: Family Medicine

## 2021-09-27 LAB — CBC
HCT: 25.6 % — ABNORMAL LOW (ref 36.0–46.0)
Hemoglobin: 8.2 g/dL — ABNORMAL LOW (ref 12.0–15.0)
MCH: 25.2 pg — ABNORMAL LOW (ref 26.0–34.0)
MCHC: 32 g/dL (ref 30.0–36.0)
MCV: 78.5 fL — ABNORMAL LOW (ref 80.0–100.0)
Platelets: 225 10*3/uL (ref 150–400)
RBC: 3.26 MIL/uL — ABNORMAL LOW (ref 3.87–5.11)
RDW: 15.3 % (ref 11.5–15.5)
WBC: 9.6 10*3/uL (ref 4.0–10.5)
nRBC: 0 % (ref 0.0–0.2)

## 2021-09-27 MED ORDER — SODIUM CHLORIDE 0.9 % IV SOLN
500.0000 mg | Freq: Once | INTRAVENOUS | Status: AC
  Administered 2021-09-27: 500 mg via INTRAVENOUS
  Filled 2021-09-27: qty 25

## 2021-09-27 NOTE — Lactation Note (Signed)
This note was copied from a baby's chart. Lactation Consultation Note  Patient Name: Gloria Berg [id number] Date: 09/27/2021 Reason for consult: Initial assessment Age:25 hours  Maternal Data    Feeding    LATCH Score                    Lactation Tools Discussed/Used    Interventions    Discharge    Consult Status Consult Status: PRN Date: 09/27/21 Follow-up type: In-patient    Theodoro Kalata 09/27/2021, 11:28 PM

## 2021-09-27 NOTE — Plan of Care (Signed)
Patient progressing in her care. She tol OOB well, managing her pain with Toradol and Vicodin (needed once) and will start Ibuprofen po this afternoon. Getting Venofer IV for decreased hgb, tol well.

## 2021-09-27 NOTE — Clinical Social Work Maternal (Signed)
CLINICAL SOCIAL WORK MATERNAL/CHILD NOTE  Patient Details  Name: Gloria Berg MRN: [id number] Date of Birth: 2020/11/23  Date:  07-Oct-2021  Clinical Social Worker Initiating Note:  Darra Lis, [place] Date/Time: Initiated:  09/27/21/0930     Child's Name:  Gloria Berg   Biological Parents:  Mother, Father Gloria Berg 09/20/1990)   Need for Interpreter:  None   Reason for Referral:  Behavioral Health Concerns, Current Substance Use/Substance Use During Pregnancy     Address:  [place] [address]    Phone number:  [phone] (home)     Additional phone number:   Household Members/Support Persons (HM/SP):   Household Member/Support Person 1, Household Member/Support Person 2   HM/SP Name Relationship DOB or Age  HM/SP -Twin Significant Other 09/20/1990  HM/SP -2 Gloria Berg. Son 10/18/2018  HM/SP -3        HM/SP -4        HM/SP -5        HM/SP -6        HM/SP -7        HM/SP -8          Natural Supports (not living in the home):  Immediate Family   Professional Supports: Case Metallurgist   Employment: Unemployed   Type of Work:     Education:  Programmer, systems   Homebound arranged:    Museum/gallery curator Resources:  [employer]   Other Resources:  [employer], Physicist, medical     Cultural/Religious Considerations Which May Impact Care:    Strengths:  Ability to meet basic needs  , Home prepared for child  , Pediatrician chosen   Psychotropic Medications:         Pediatrician:    [place]  Pediatrician List:   [facility]  [place]      Pediatrician Fax Number:    Risk Factors/Current Problems:  None   Cognitive State:  Alert  , Insightful  , Linear Thinking     Mood/Affect:  Interested  , Bright  , Happy     CSW Assessment: CSW consulted for history of anxiety, depression and THC use. CSW met with MOB  to assess and offer support. CSW introduced self and role. CSW observed MGM present and was provided permission to speak with MOB alone. CSW observed infant sleeping. CSW informed MOB of the reason for consult. MOB was easily engaged and pleasant. MOB shared she lives with FOB and her son. MOB receives both [facility] and food stamp resources. MOB disclosed she was diagnosed with anxiety in 2020/12/04, following the death of her fiance and depression in Dec 04, 2018 after the birth of her son. CSW asked MOB how she has previously coped with symptoms. MOB reported she coped by sleeping a lot. MOB stated she was prescribed Zoloft but she feels it made the symptoms worse. During the recently pregnancy, MOB was seen by behavioral health at [facility], which she found to be helpful. MOB reported she plans to continue therapy postpartum and feels comfortable reaching out if needs arise. In addition to FOB, MOB identified her parents as supports. MOB denies any current SI, HI or being involved in DV.   CSW inquired on substance use. MOB reported she used THC prior to learning about the pregnancy, as well as once in November while experiencing an anxiety attack. MOB denies any  additional substance use. CSW informed MOB of the hospital drug screen policy. CSW informed MOB infant's UDS is negative and the CDS will be followed. MOB was understanding and denied any questions.  ° °CSW discussed the baby blues period versus PPD. MOB was provided the New Mom Checklist and mental health resources. MOB reported she feels comfortable contacting a professional if needs arise.  °CSW provided review of Sudden Infant Death Syndrome (SIDS) precautions. MOB stated she has infant's essentials, including a bassinet and car seat. MOB denies any barriers to infant's follow-up care. MOB stated she has no additional needs at this time. ° °CSW will continue to follow CDS and make a CPS report if warranted. CSW identifies no further need for intervention and no barriers to  discharge at this time. ° °CSW Plan/Description:  No Further Intervention Required/No Barriers to Discharge, CSW Will Continue to Monitor Umbilical Cord Tissue Drug Screen Results and Make Report if Warranted, Child Protective Service Report  , Hospital Drug Screen Policy Information, Perinatal Mood and Anxiety Disorder (PMADs) Education, Sudden Infant Death Syndrome (SIDS) Education, Other Information/Referral to Community Resources, Other Patient/Family Education  ° ° °Gloria Berg, LCSWA °09/27/2021, 11:34 AM ° °

## 2021-09-27 NOTE — Progress Notes (Signed)
Subjective: Postpartum Day 1: Cesarean Delivery Patient reports tolerating PO and no problems voiding. Notes that her pain is well-controlled. States she was up out of bed with nursing late last night with no significant increase in pain. Reports minimal lochia. States she has not been passing flatus.   Objective: Vital signs in last 24 hours: Temp:  [97.4 F (36.3 C)-98.5 F (36.9 C)] 98.5 F (36.9 C) (01/16 2300) Pulse Rate:  [81-121] 101 (01/16 2300) Resp:  [17-21] 18 (01/16 2300) BP: (98-118)/(54-84) 113/65 (01/16 2300) SpO2:  [98 %-100 %] 100 % (01/16 2300) Weight:  [89.4 kg] 89.4 kg (01/16 0803)  Physical Exam:  General: Well-appearing, alert, cooperative, appears stated age, and no distress laying in bed with newborn on chest Lochia: appropriate Uterine Fundus: firm Incision: Dressing clean/dry/intact DVT Evaluation: No evidence of DVT seen on physical exam. No cords or calf tenderness. No significant calf/ankle edema.  Recent Labs    09/27/21 0520  HGB 8.2*  HCT 25.6*    Assessment/Plan: POD#1 status post repeat Cesarean section. Doing well postoperatively. Continue routine postpartum care. Desires POPs for contraception.  Acute blood loss anemia: Hgb downtrended from 11 to now 8.2. Recommend IV iron at this time, will discuss with patient.   Plan for discharge tentatively on POD#2.  Fabiola Backer, MD 09/27/2021, 7:34 AM

## 2021-09-27 NOTE — Lactation Note (Signed)
This note was copied from a baby's chart. Lactation Consultation Note Mom told RN that she didn't feel like she needs any assistance from [facility] at this time. Will keep mom PRN.  Patient Name: Kenni Newton [id number] Date: 09/27/2021   Age:25 hours  Maternal Data    Feeding    LATCH Score                    Lactation Tools Discussed/Used    Interventions    Discharge    Consult Status      Charyl Dancer 09/27/2021, 11:26 PM

## 2021-09-28 MED ORDER — HYDROCODONE-ACETAMINOPHEN 5-325 MG PO TABS: 1.0000 | ORAL_TABLET | ORAL | 0 refills | Status: AC | PRN

## 2021-09-28 MED ORDER — NORETHINDRONE 0.35 MG PO TABS: 1.0000 | ORAL_TABLET | Freq: Every day | ORAL | 11 refills | Status: DC

## 2021-09-28 MED ORDER — IBUPROFEN 600 MG PO TABS
600.0000 mg | ORAL_TABLET | Freq: Four times a day (QID) | ORAL | 0 refills | Status: AC
Start: 2021-09-28 — End: ?

## 2021-09-28 NOTE — Social Work (Signed)
CSW acknowledges consult for MOB scoring 11 on the Edinburgh Postpartum Depression Scale. ° °CSW has completed an assessment with MOB, providing MOB with mental health resources regarding PPD and perinatal mood disorders. MOB expressed no additional concerns. ° °CSW identifies no further need for intervention and no barriers to discharge at this time. ° °Izek Corvino, LCSWA °Clinical Social Work °[facility] °[phone]  °

## 2021-09-30 NOTE — BH Specialist Note (Signed)
Pt did not arrive to video visit and did not answer the phone; Left HIPPA-compliant message to call back Shaiden Aldous from [facility] at  [phone] (Andraya Frigon's office).  ?; left MyChart message for patient.  ? ?

## 2021-10-03 ENCOUNTER — Ambulatory Visit (INDEPENDENT_AMBULATORY_CARE_PROVIDER_SITE_OTHER): Payer: Medicaid Other | Admitting: *Deleted

## 2021-10-03 ENCOUNTER — Other Ambulatory Visit: Payer: Self-pay

## 2021-10-03 VITALS — BP 113/72 | HR 96 | Ht 63.0 in | Wt 180.0 lb

## 2021-10-03 DIAGNOSIS — Z5189 Encounter for other specified aftercare: Secondary | ICD-10-CM

## 2021-10-03 DIAGNOSIS — Z0130 Encounter for examination of blood pressure without abnormal findings: Secondary | ICD-10-CM

## 2021-10-03 NOTE — Progress Notes (Signed)
Patient was assessed and managed by nursing staff during this encounter. I have reviewed the chart and agree with the documentation and plan. I have also made any necessary editorial changes. ° °Elson Ulbrich A Lekendrick Alpern, MD °10/03/2021 5:50 PM   °

## 2021-10-03 NOTE — Progress Notes (Signed)
Here for incision check and bp check after repeat C/S 09/26/21. BP wnl. Incision intact with honeycomb dressing. Honeycomb dressing removed , dried dark brown discharge noted.  Incision intact with steristrips. Steristrips removed gently. Incision CDI. Reviewed wound care and to call if any issues.  Reviewed postpartum appointment and 2 hour glucose appointment and to be fasting. She voices understanding. Makinsley Schiavi,RN

## 2021-10-04 ENCOUNTER — Encounter: Payer: Self-pay | Admitting: Family Medicine

## 2021-10-13 ENCOUNTER — Ambulatory Visit: Payer: Medicaid Other | Admitting: Clinical

## 2021-10-13 DIAGNOSIS — Z91199 Patient's noncompliance with other medical treatment and regimen due to unspecified reason: Secondary | ICD-10-CM

## 2021-11-07 ENCOUNTER — Other Ambulatory Visit: Payer: Self-pay

## 2021-11-07 ENCOUNTER — Encounter: Payer: Self-pay | Admitting: Nurse Practitioner

## 2021-11-07 ENCOUNTER — Ambulatory Visit (INDEPENDENT_AMBULATORY_CARE_PROVIDER_SITE_OTHER): Payer: Medicaid Other | Admitting: Nurse Practitioner

## 2021-11-07 ENCOUNTER — Other Ambulatory Visit: Payer: Medicaid Other

## 2021-11-07 DIAGNOSIS — O24419 Gestational diabetes mellitus in pregnancy, unspecified control: Secondary | ICD-10-CM

## 2021-11-07 DIAGNOSIS — Z8632 Personal history of gestational diabetes: Secondary | ICD-10-CM

## 2021-11-07 DIAGNOSIS — Z6832 Body mass index (BMI) 32.0-32.9, adult: Secondary | ICD-10-CM

## 2021-11-07 DIAGNOSIS — R87612 Low grade squamous intraepithelial lesion on cytologic smear of cervix (LGSIL): Secondary | ICD-10-CM

## 2021-11-07 MED ORDER — LEVONORGESTREL-ETHINYL ESTRAD 0.15-30 MG-MCG PO TABS: 1.0000 | ORAL_TABLET | Freq: Every day | ORAL | 2 refills | Status: AC

## 2021-11-07 NOTE — Progress Notes (Signed)
Post Partum Visit Note  Gloria Berg is a 26 y.o. G60P2002 female who presents for a postpartum visit. She is 6 weeks postpartum following a repeat cesarean section.  I have fully reviewed the prenatal and intrapartum course. The delivery was at 39 gestational weeks by Cesarean Birth.  She did have a postpartum hemorrhage and treated with Venofer.  Anesthesia: spinal. Postpartum course has been going well per patient. Baby is doing well. Baby is feeding by bottle - Gloria Berg . Bleeding no bleeding. Bowel function is normal. Bladder function is normal. Patient is sexually active. Contraception method is none. Postpartum depression screening: negative.  She was given Micronor to start but did not take the pills - was waiting for her menses to start.   The pregnancy intention screening data noted above was reviewed. Potential methods of contraception were discussed. The patient elected to proceed with OCPs   Edinburgh Postnatal Depression Scale - 11/07/21 0832       Edinburgh Postnatal Depression Scale:  In the Past 7 Days   I have been able to laugh and see the funny side of things. 0    I have looked forward with enjoyment to things. 0    I have blamed myself unnecessarily when things went wrong. 0    I have been anxious or worried for no good reason. 2    I have felt scared or panicky for no good reason. 0    Things have been getting on top of me. 0    I have been so unhappy that I have had difficulty sleeping. 0    I have felt sad or miserable. 0    I have been so unhappy that I have been crying. 0    The thought of harming myself has occurred to me. 0    Edinburgh Postnatal Depression Scale Total 2             Health Maintenance Due  Topic Date Due   COVID-19 Vaccine (1) Never done   URINE MICROALBUMIN  Never done   HPV VACCINES (1 - 2-dose series) Never done   INFLUENZA VACCINE  Never done    The following portions of the patient's history were reviewed and  updated as appropriate: allergies, current medications, past family history, past medical history, past social history, past surgical history, and problem list.  Review of Systems Pertinent items noted in HPI and remainder of comprehensive ROS otherwise negative.  Objective:  BP 113/68    Pulse 96    Wt 184 lb 9.6 oz (83.7 kg)    Breastfeeding No    BMI 32.70 kg/m    General:  alert, cooperative, and no distress   Breasts:  not indicated  Lungs: clear to auscultation bilaterally  Heart:  regular rate and rhythm, S1, S2 normal, no murmur, click, rub or gallop  Abdomen: Not examined    Wound healed  GU exam:  not indicated       Assessment:    Normal postpartum exam.   Plan:   Essential components of care per ACOG recommendations:  1.  Mood and well being: Patient with negative depression screening today. Reviewed local resources for support.  - Patient tobacco use? No.   - hx of drug use? No.    2. Infant care and feeding:  -Patient currently breastmilk feeding? No.  -Social determinants of health (SDOH) reviewed in EPIC. No concerns  3. Sexuality, contraception and birth spacing - Patient does not  want a pregnancy in the next year.   - Reviewed forms of contraception in tiered fashion. Patient desired oral contraceptives (estrogen/progesterone) today.  Never took her POPs prescribed while in the hospital - Discussed birth spacing of 18 months  4. Sleep and fatigue -Encouraged family/partner/community support of 4 hrs of uninterrupted sleep to help with mood and fatigue  5. Physical Recovery  - Discussed patients delivery and complications. She describes her labor as good. - Patient had a C-section. Patient had a  postpartum hemorrhage . Perineal healing reviewed. Patient expressed understanding - Patient has urinary incontinence? No. - Patient is safe to resume physical and sexual activity  6.  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  -Breast Cancer screening indicated? No.   7. Chronic Disease/Pregnancy Condition follow up:  Having 2 hr glucola done today and will follow up results. Has lost pregnancy weight but BMI still 32 and advised weight loss to BMI less than 25 for her best health and to decrease the risk of Type 2 Diabetes. Pap due in August. Prescribed OCPs today - no longer breastfeeding. Advised to start pills today Advised to return in 3 months for BP check and pill refill.   Gloria Rochester, NP [facility], [place]

## 2021-11-08 LAB — GLUCOSE TOLERANCE, 2 HOURS
Glucose, 2 hour: 93 mg/dL (ref 70–139)
Glucose, GTT - Fasting: 100 mg/dL — ABNORMAL HIGH (ref 70–99)

## 2021-12-09 ENCOUNTER — Ambulatory Visit: Payer: Medicaid Other | Admitting: Cardiology

## 2021-12-13 ENCOUNTER — Encounter: Payer: Self-pay | Admitting: Cardiology

## 2022-01-29 ENCOUNTER — Other Ambulatory Visit: Payer: Self-pay | Admitting: Nurse Practitioner

## 2022-02-03 ENCOUNTER — Ambulatory Visit: Payer: Medicaid Other

## 2022-02-03 ENCOUNTER — Encounter: Payer: Self-pay | Admitting: *Deleted

## 2022-06-14 IMAGING — US US MFM OB FOLLOW-UP
1 series · 13 of 28 positions shown · non-contrast
Comparison: none

[Series 1: us mfm ob follow-up · 51 acquisitions, 13 frames shown]
[im 2/51]
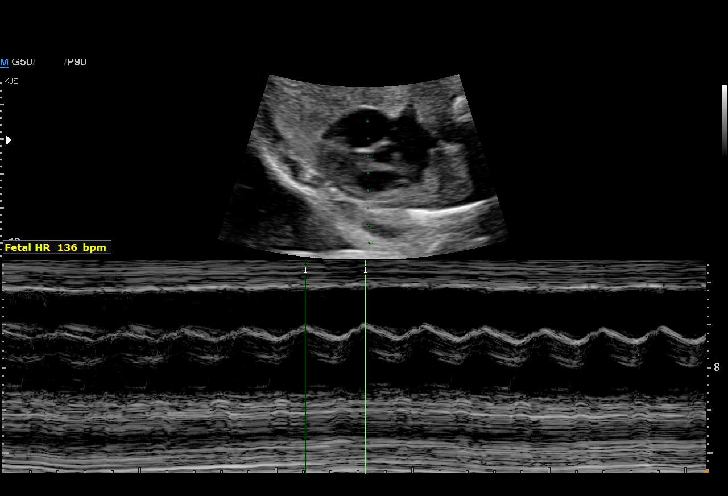
[im 6/51]
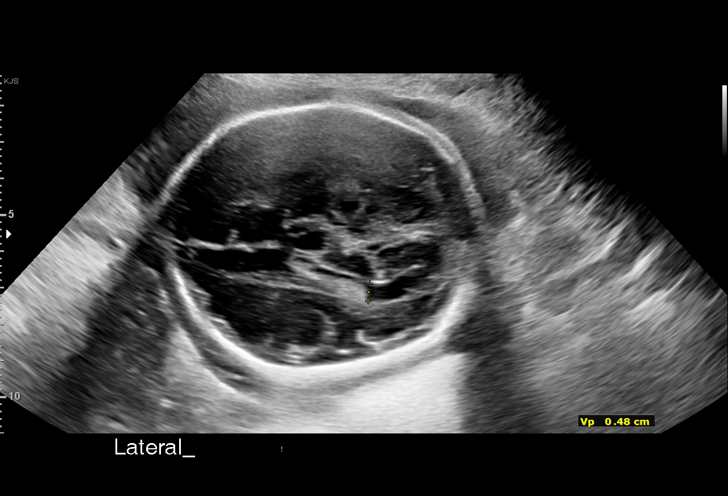
[im 10/51]
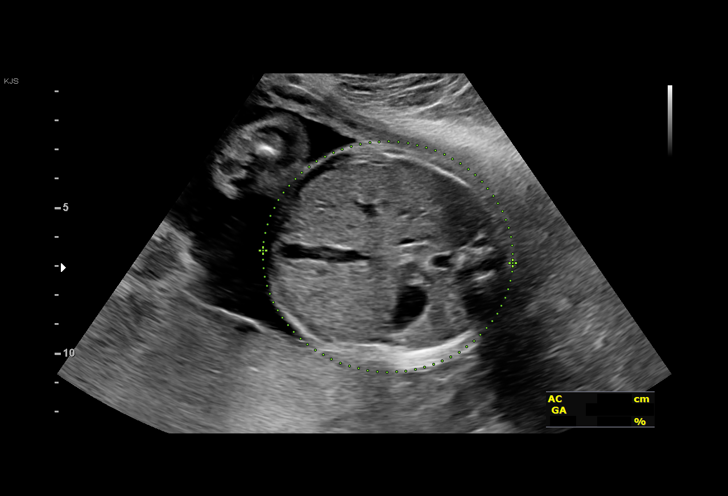
[im 13/51]
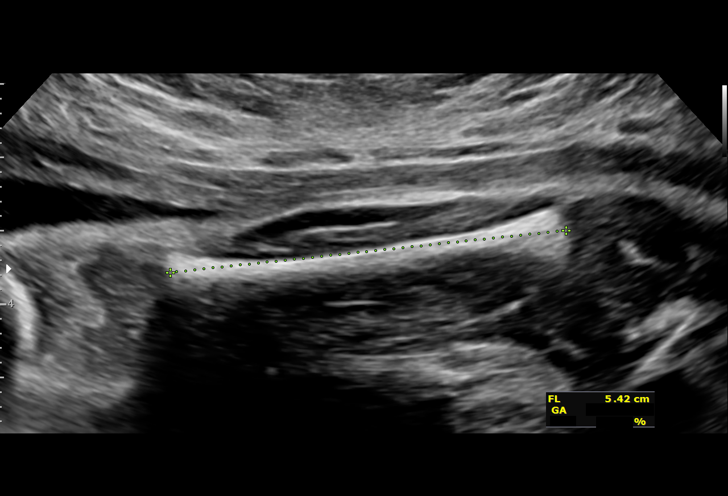
[im 17/51]
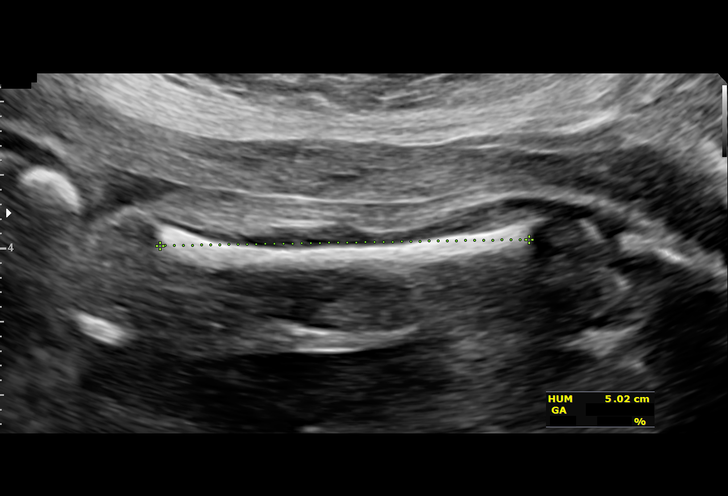
[im 21/51]
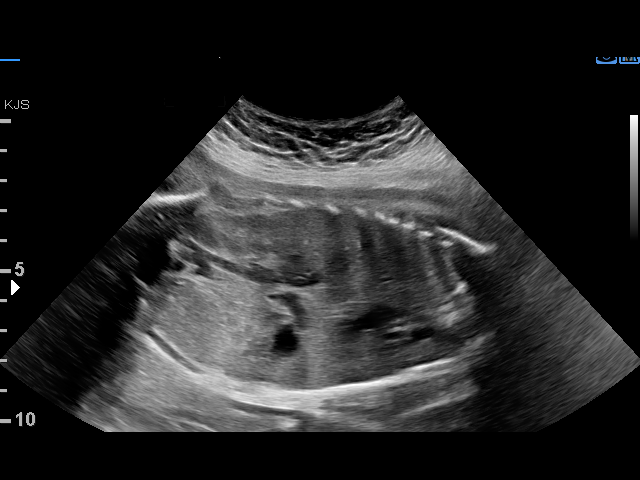
[im 26/51]
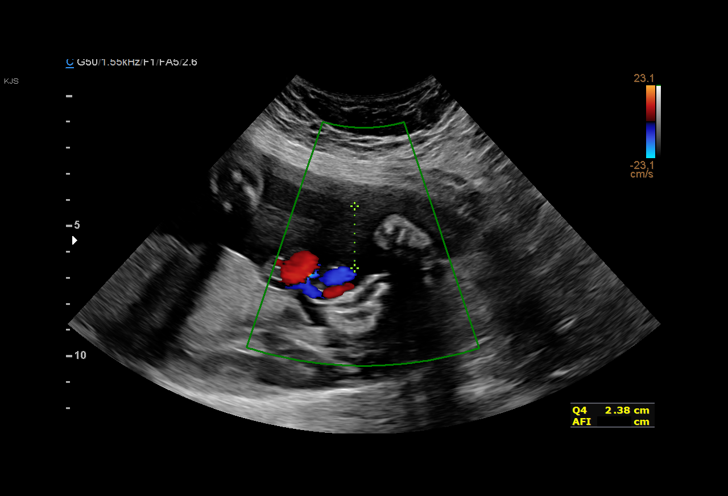
[im 30/51]
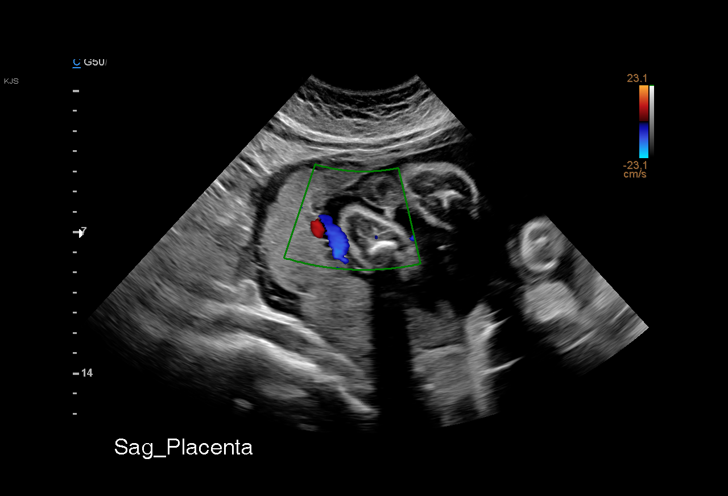
[im 34/51]
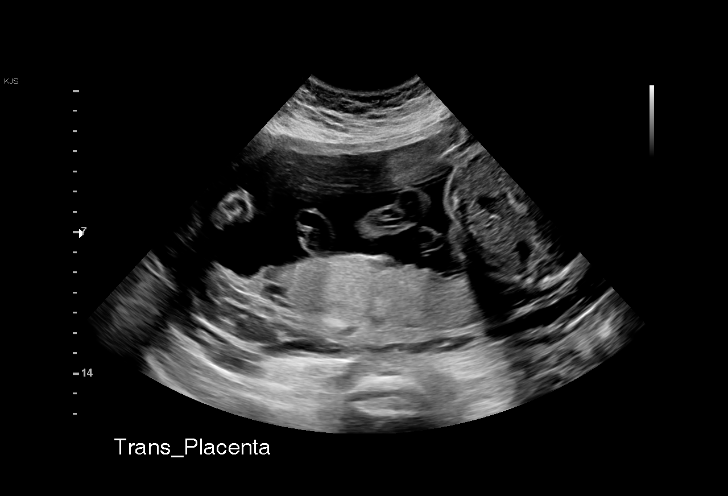
[im 38/51]
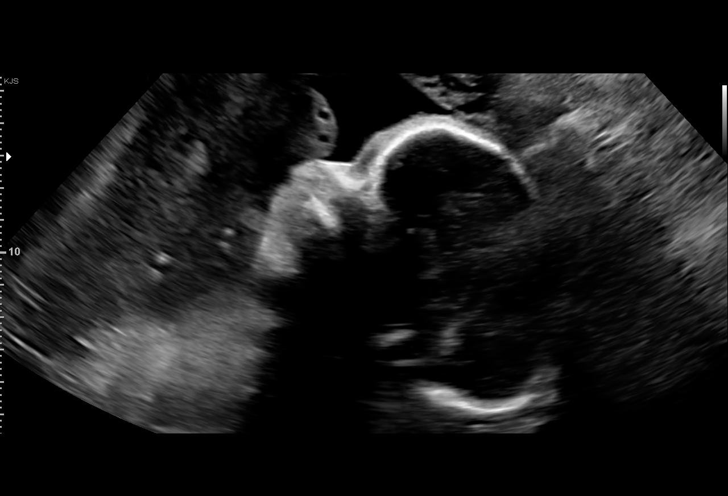
[im 41/51]
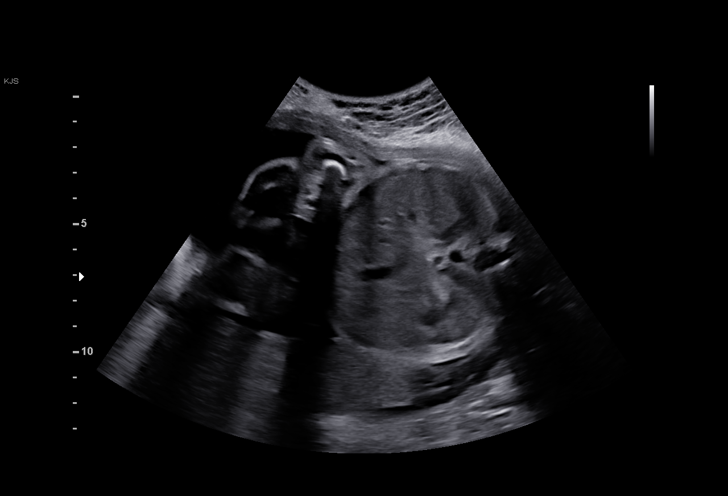
[im 45/51]
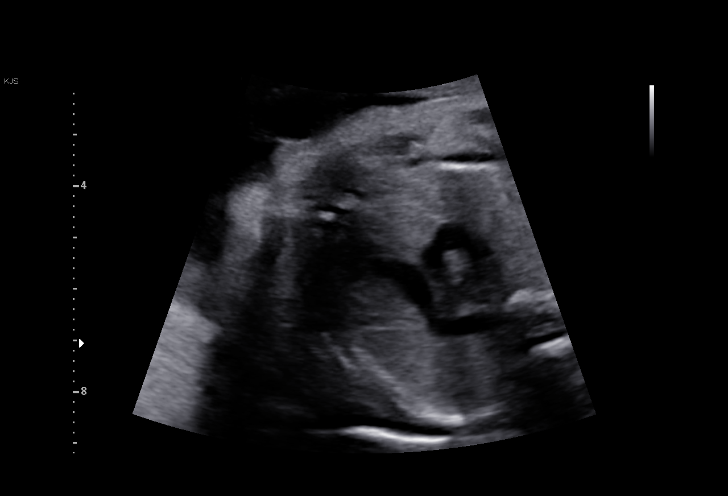
[im 49/51]
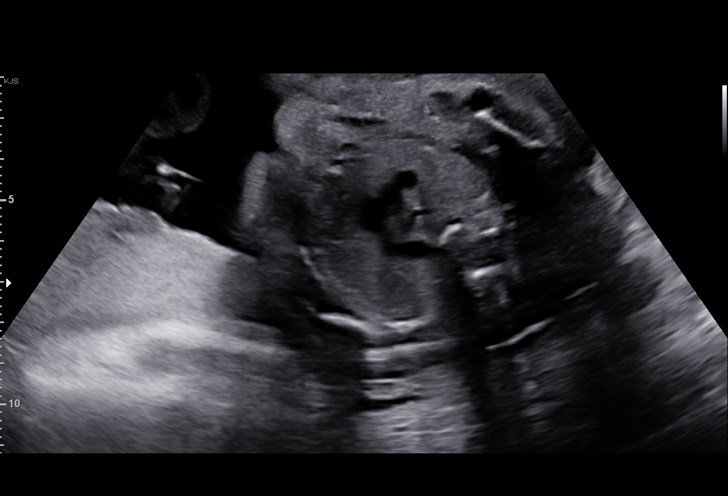

[13 of 28 positions shown; findings below may reference images not displayed]

Indications

 Obesity complicating pregnancy, third
 trimester
 28 weeks gestation of pregnancy
 Encounter for other antenatal screening
 follow-up
 History of cesarean delivery, currently
 pregnant
 Medical complication of pregnancy ([place]
 [place] Spotted Fever)
Fetal Evaluation

 Num Of Fetuses:         1
 Fetal Heart Rate(bpm):  136
 Cardiac Activity:       Observed
 Presentation:           Cephalic
 Placenta:               Posterior Fundal
 P. Cord Insertion:      Visualized, central

 Amniotic Fluid
 AFI FV:      Within normal limits

 AFI Sum(cm)     %Tile       Largest Pocket(cm)
 15.09           53

 RUQ(cm)       RLQ(cm)       LUQ(cm)        LLQ(cm)

Biometry

 BPD:      72.2  mm     G. Age:  29w 0d         51  %    CI:        74.85   %    70 - 86
                                                         FL/HC:      20.5   %    19.6 -
 HC:      264.8  mm     G. Age:  28w 6d         25  %    HC/AC:      1.01        0.99 -
 AC:      260.9  mm     G. Age:  30w 2d         87  %    FL/BPD:     75.3   %    71 - 87
 FL:       54.4  mm     G. Age:  28w 5d         40  %    FL/AC:      20.9   %    20 - 24
 HUM:      49.9  mm     G. Age:  29w 2d         59  %
 CER:      34.7  mm     G. Age:  29w 2d         69  %

 LV:        4.8  mm
 CM:        7.7  mm

 Est. FW:    6136  gm      3 lb 2 oz     74  %
OB History

 Gravidity:    2         Term:   1        Prem:   0        SAB:   0
 TOP:          0       Ectopic:  0        Living: 1
Gestational Age

 LMP:           28w 4d        Date:  12/27/20                 EDD:   10/03/21
 U/S Today:     29w 2d                                        EDD:   09/28/21
 Best:          28w 4d     Det. By:  LMP  (12/27/20)          EDD:   10/03/21
Anatomy

 Cranium:               Appears normal         LVOT:                   Previously seen
 Cavum:                 Appears normal         Aortic Arch:            Previously seen
 Ventricles:            Appears normal         Ductal Arch:            Previously seen
 Choroid Plexus:        Previously seen        Diaphragm:              Appears normal
 Cerebellum:            Appears normal         Stomach:                Appears normal, left
                                                                       sided
 Posterior Fossa:       Previously seen        Abdomen:                Previously seen
 Nuchal Fold:           Previously seen        Abdominal Wall:         Previously seen
 Face:                  Orbits and profile     Cord Vessels:           Previously seen
                        previously seen
 Lips:                  Previously seen        Kidneys:                Appear normal
 Palate:                Previously seen        Bladder:                Appears normal
 Thoracic:              Previously seen        Spine:                  Previously seen
 Heart:                 Previously seen        Upper Extremities:      Previously seen
 RVOT:                  Previously seen        Lower Extremities:      Previously seen
Cervix Uterus Adnexa

 Cervix
 Length:           3.84  cm.
 Normal appearance by transabdominal scan.

 Uterus
 No abnormality visualized.

 Right Ovary
 No adnexal mass visualized.

 Left Ovary
 No adnexal mass visualized.

 Cul De Sac
 No free fluid seen.

 Adnexa
 No adnexal mass visualized.
Comments

 This patient was seen for a follow up growth scan due to
 maternal obesity.  She was diagnosed with Nneka Olmedo
 [place] fever a few weeks ago and was treated with a full
 course of doxycycline.  The patient reports that she has
 recovered and has not experienced any sequelae from the
 infection.
 She was informed that the fetal growth and amniotic fluid
 level appears appropriate for her gestational age.
 Due to her infection with [place] S[place]on fever, we
 will continue to follow her with monthly growth ultrasounds.
 Another growth scan was scheduled in 4 weeks.

## 2022-07-16 IMAGING — US US MFM OB FOLLOW-UP
1 series · 14 of 28 positions shown · non-contrast
Comparison: none

[Series 1: us mfm ob follow-up · 45 acquisitions, 14 frames shown]
[im 2/45]
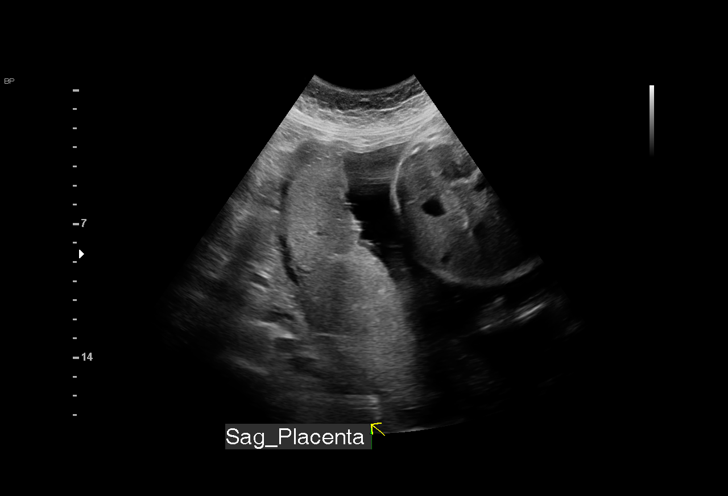
[im 5/45]
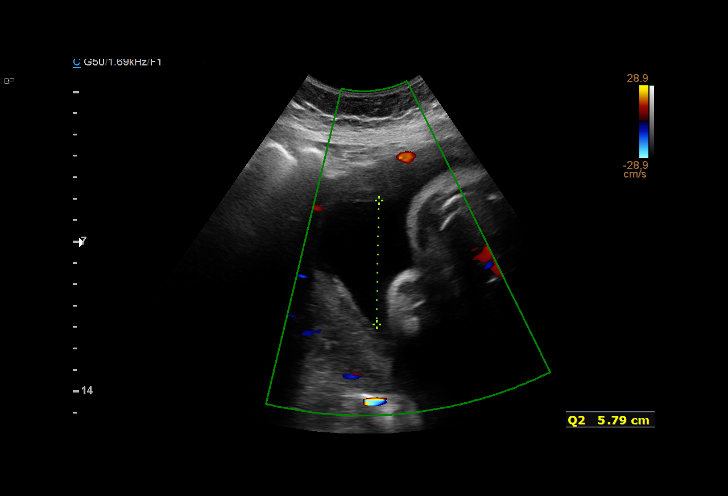
[im 9/45]
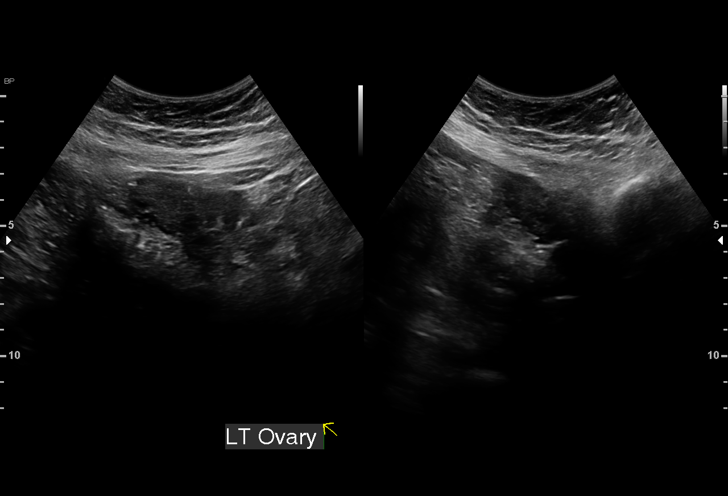
[im 12/45]
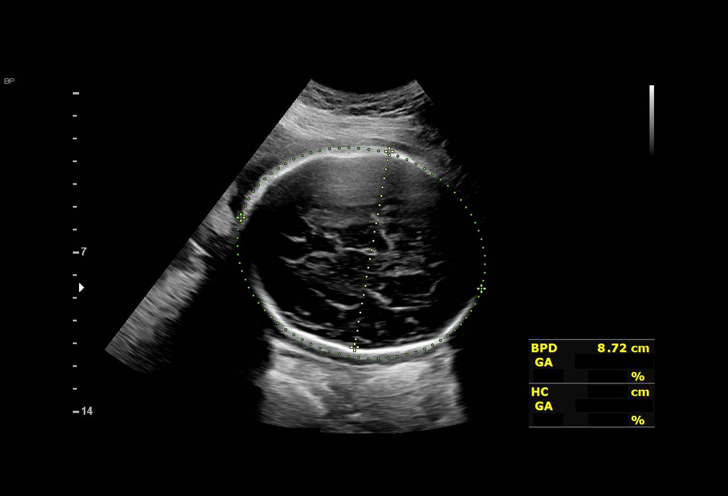
[im 15/45]
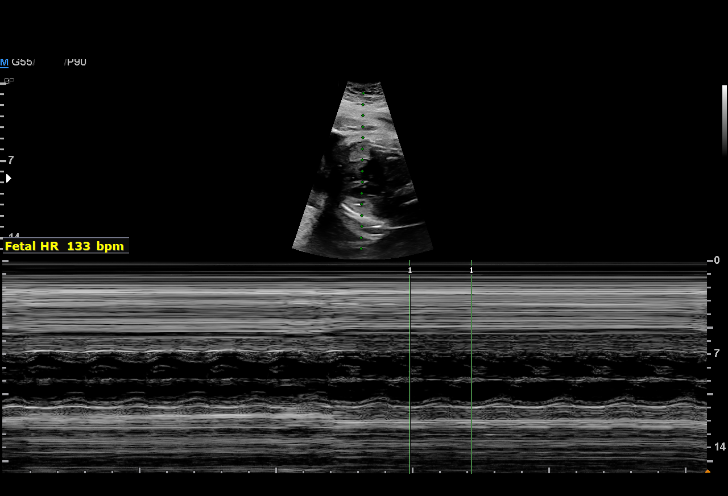
[im 18/45]
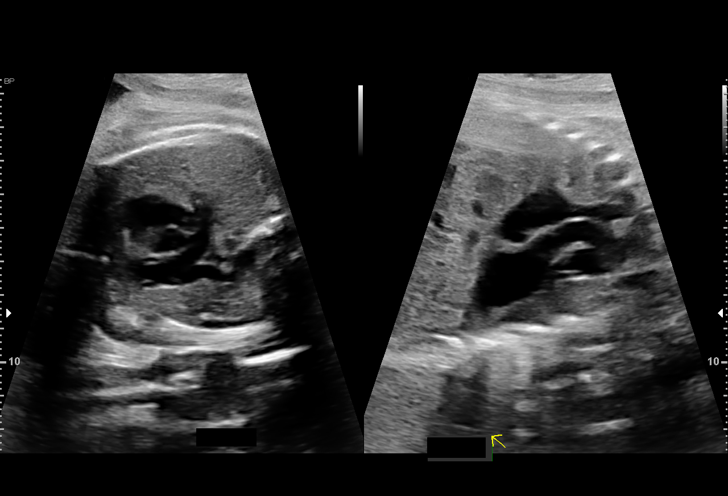
[im 22/45]
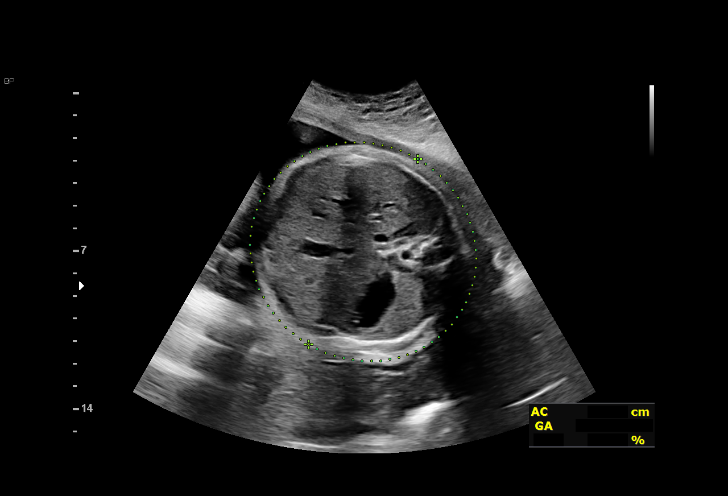
[im 25/45]
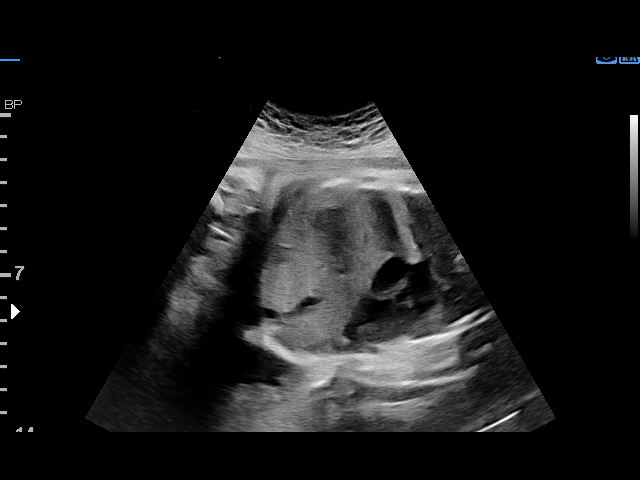
[im 28/45]
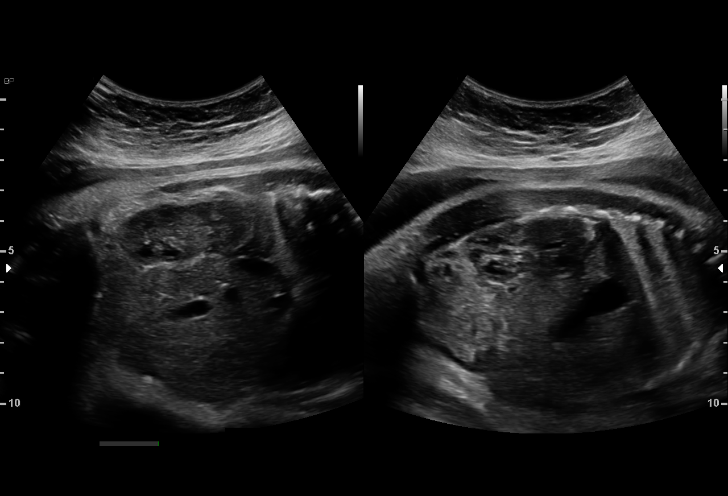
[im 31/45]
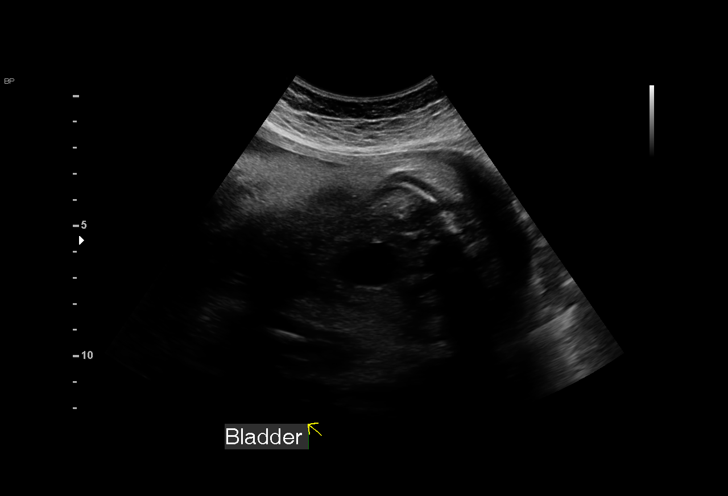
[im 35/45]
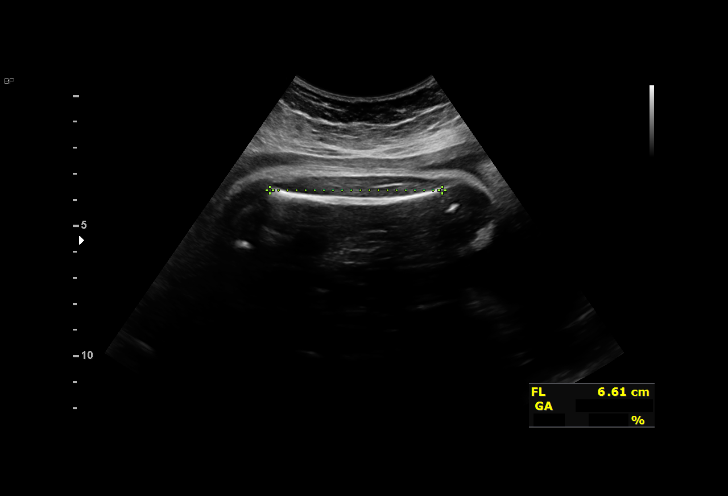
[im 38/45]
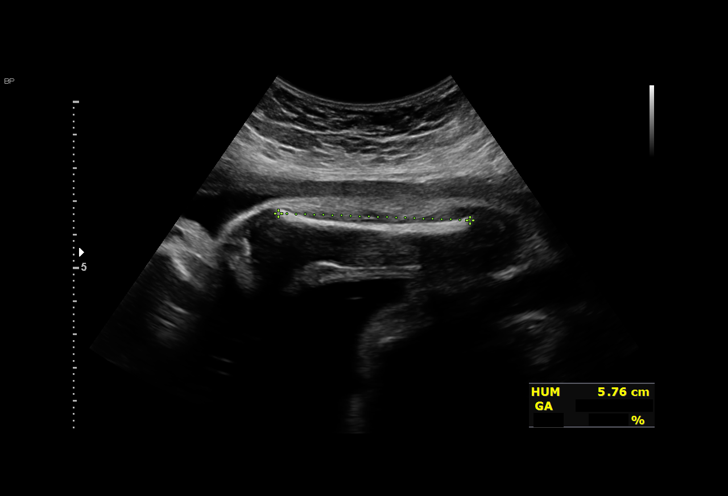
[im 41/45]
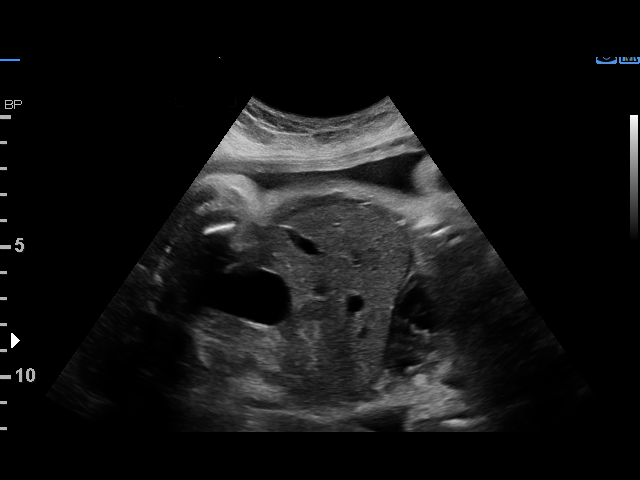
[im 45/45]
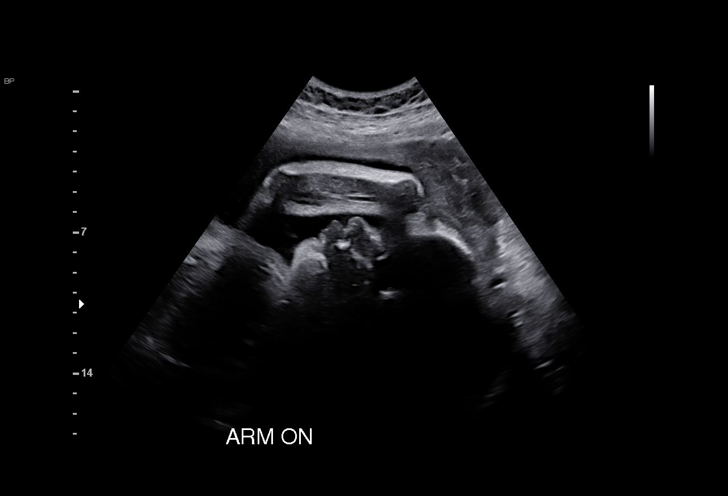

[14 of 28 positions shown; findings below may reference images not displayed]

Indications

 Medical complication of pregnancy ([place]
 Mountain Spotted Fever)
 Obesity complicating pregnancy, third
 trimester (BMI 32.3)
 History of cesarean delivery, currently
 pregnant
 33 weeks gestation of pregnancy
 Gestational diabetes in pregnancy,
 unspecified control
Fetal Evaluation

 Num Of Fetuses:         1
 Fetal Heart Rate(bpm):  133
 Cardiac Activity:       Observed
 Presentation:           Cephalic
 Placenta:               Posterior Fundal
 P. Cord Insertion:      Previously Visualized

 Amniotic Fluid
 AFI FV:      Within normal limits

 AFI Sum(cm)     %Tile       Largest Pocket(cm)
 11.04           26

 RUQ(cm)       RLQ(cm)       LUQ(cm)        LLQ(cm)
 6.48          0
Biometry

 BPD:      87.4  mm     G. Age:  35w 2d         93  %    CI:        75.86   %    70 - 86
                                                         FL/HC:      20.7   %    19.9 -
 HC:      318.1  mm     G. Age:  35w 6d         81  %    HC/AC:      1.02        0.96 -
 AC:      311.5  mm     G. Age:  35w 1d         94  %    FL/BPD:     75.3   %    71 - 87
 FL:       65.8  mm     G. Age:  33w 6d         60  %    FL/AC:      21.1   %    20 - 24
 HUM:      57.2  mm     G. Age:  33w 1d         60  %

 LV:        5.8  mm

 Est. FW:    8109  gm      5 lb 9 oz     89  %
OB History

 Gravidity:    2         Term:   1        Prem:   0        SAB:   0
 TOP:          0       Ectopic:  0        Living: 1
Gestational Age

 LMP:           33w 1d        Date:  12/27/20                 EDD:   10/03/21
 U/S Today:     35w 0d                                        EDD:   09/20/21
 Best:          33w 1d     Det. By:  LMP  (12/27/20)          EDD:   10/03/21
Anatomy

 Cranium:               Appears normal         LVOT:                   Appears normal
 Cavum:                 Appears normal         Aortic Arch:            Previously seen
 Ventricles:            Appears normal         Ductal Arch:            Previously seen
 Choroid Plexus:        Previously seen        Diaphragm:              Appears normal
 Cerebellum:            Previously seen        Stomach:                Appears normal, left
                                                                       sided
 Posterior Fossa:       Previously seen        Abdomen:                Previously seen
 Nuchal Fold:           Previously seen        Abdominal Wall:         Previously seen
 Face:                  Orbits and profile     Cord Vessels:           Appears normal (3
                        previously seen                                vessel cord)
 Lips:                  Previously seen        Kidneys:                Appear normal
 Palate:                Previously seen        Bladder:                Appears normal
 Thoracic:              Previously seen        Spine:                  Previously seen
 Heart:                 Appears normal         Upper Extremities:      Previously seen
                        (4CH, axis, and
                        situs)
 RVOT:                  Appears normal         Lower Extremities:      Previously seen

 Other:  Fetus appears to be a male. Technically difficult due to advanced
         gestational age.
Cervix Uterus Adnexa

 Cervix
 Not visualized (advanced GA >41wks)

 Uterus
 No abnormality visualized.

 Right Ovary
 Not visualized.

 Left Ovary
 Within normal limits.

 Cul De Sac
 No free fluid seen.
 Adnexa
 No adnexal mass visualized.
Impression

 Gestational diabetes.  Patient reports the diabetes is well
 controlled on diet.
 Fetal growth is appropriate for gestational.  The estimated
 fetal weight is at the 89th percentile.  Amniotic fluid is normal
 good fetal activity seen.

 Patient completely recovered from [employer]
 fever after treatment.
Recommendations

 -An appointment was made for her to return in 4 weeks for
 fetal growth assessment.
                 Mazon, Tala

## 2022-08-14 IMAGING — DX DG CHEST 1V PORT
1 series · 1 of 1 positions shown · non-contrast
Comparison: None.

CLINICAL DATA: Shortness of breath.

EXAM:
PORTABLE CHEST 1 VIEW

[chest ap]
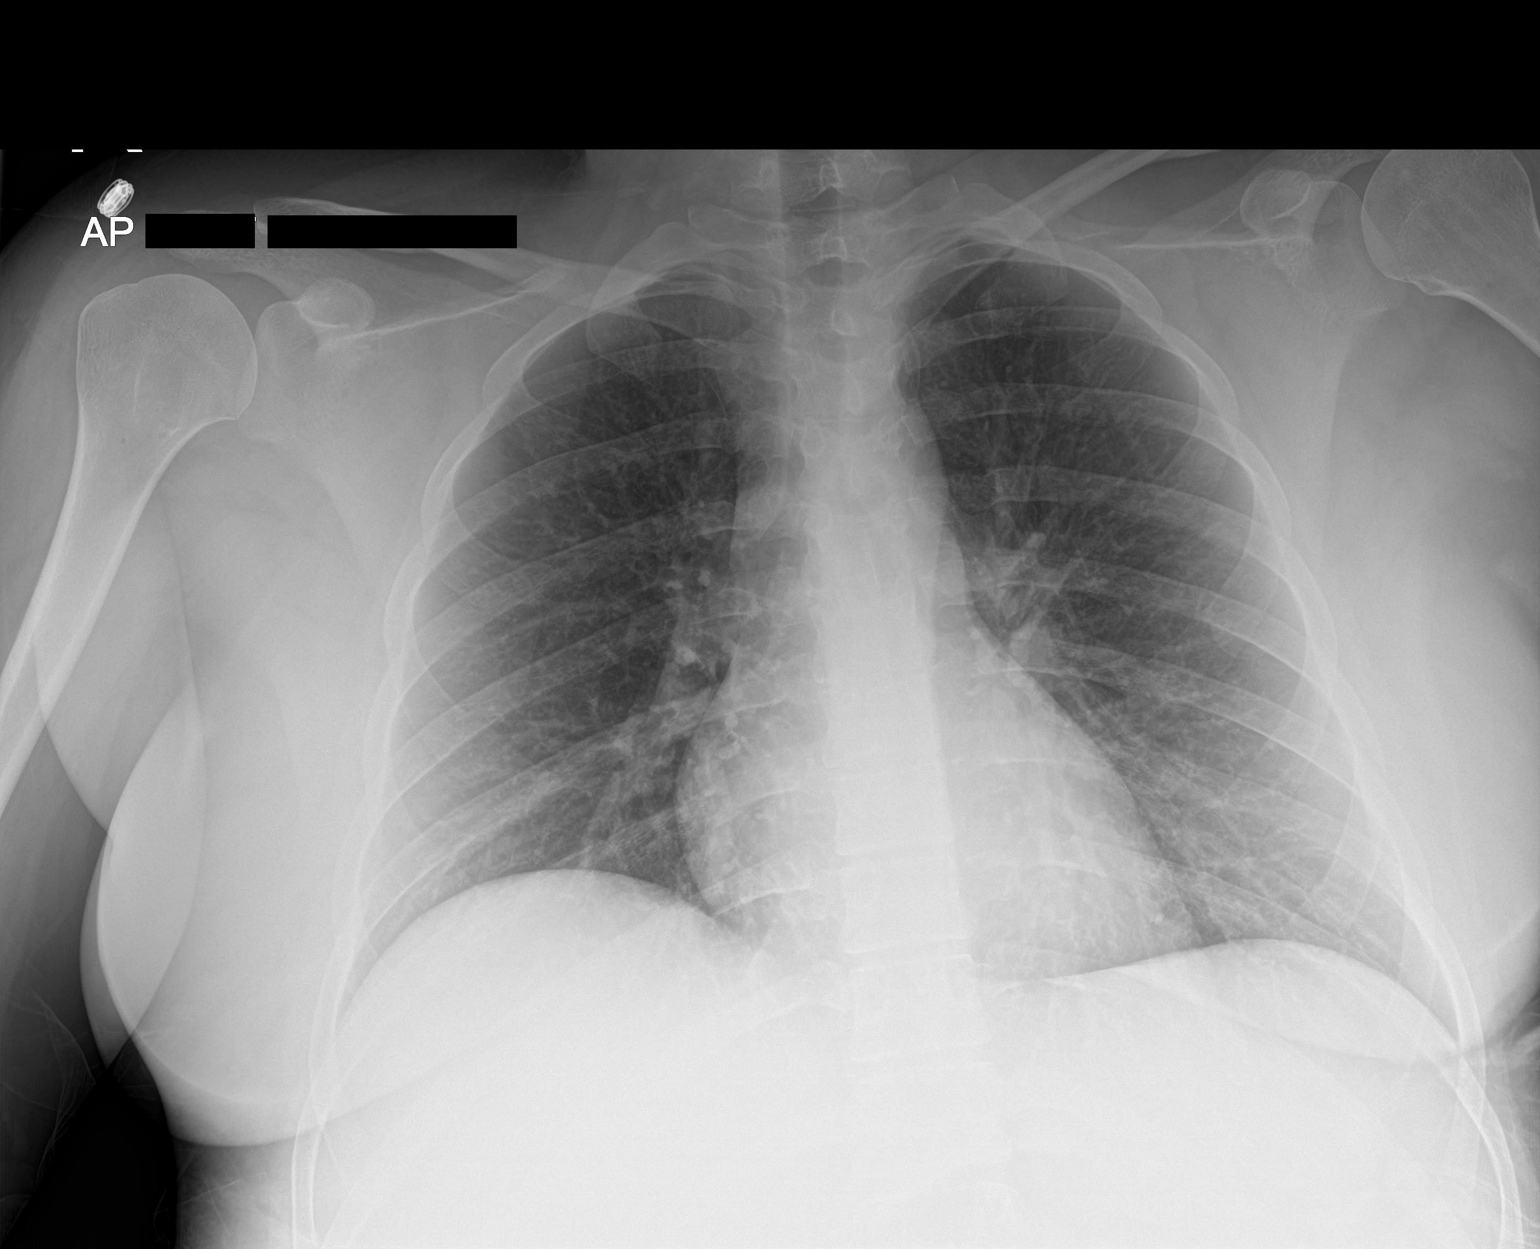

[1 of 1 positions shown; findings below may reference images not displayed]

FINDINGS: The heart size and mediastinal contours are within normal limits.
Both lungs are clear. The visualized skeletal structures are
unremarkable.
IMPRESSION: No active disease.

## 2022-08-14 IMAGING — CT CT ANGIO CHEST
2 of 6 series · 17 of 46 positions shown · IV contrast (omnipaque)
Comparison: None.
COMPARISON: None.

Addendum:
CLINICAL DATA: Chest pain or SOB, pleurisy or effusion suspected.
Tachycardia

EXAM:
CT ANGIOGRAPHY CHEST WITH CONTRAST
TECHNIQUE: Multidetector CT imaging of the chest was performed using the
standard protocol during bolus administration of intravenous
contrast. Multiplanar CT image reconstructions and MIPs were
obtained to evaluate the vascular anatomy.
CONTRAST:  80mL OMNIPAQUE IOHEXOL 350 MG/ML SOLN

[Series 7: thins · axial · 0.90mm/px · z∈[+1190,+1451]mm · 14 of 287 slices shown]
[im 13/287  lung]
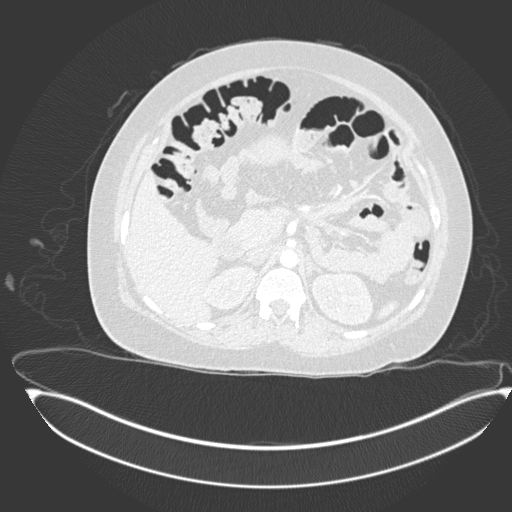
[im 38/287  soft-tissue]
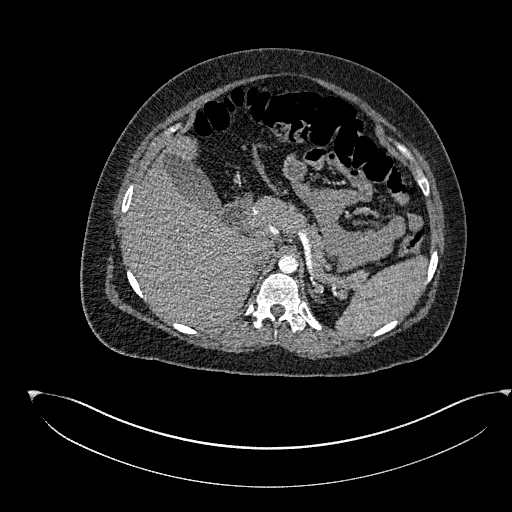
[im 50/287  lung]
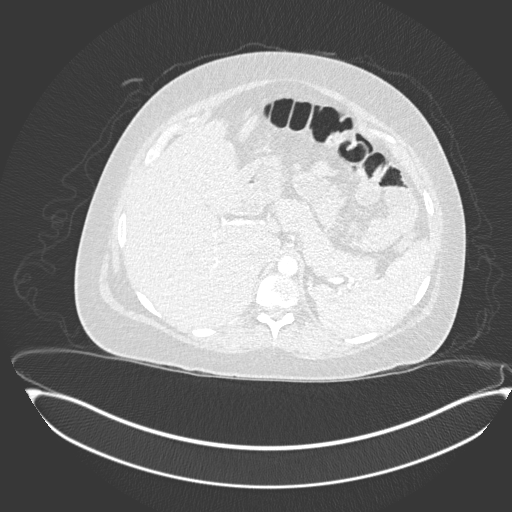
[im 75/287  soft-tissue]
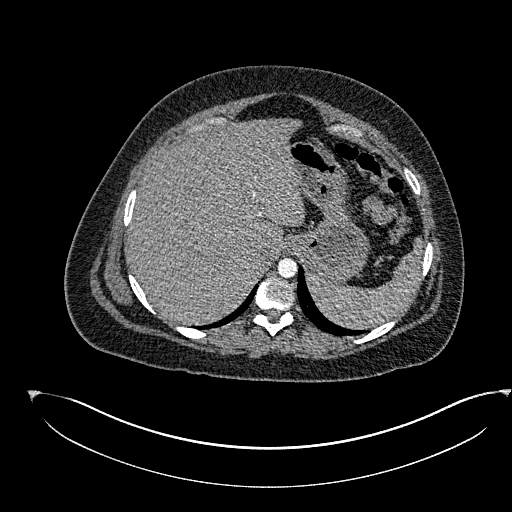
[im 100/287  lung]
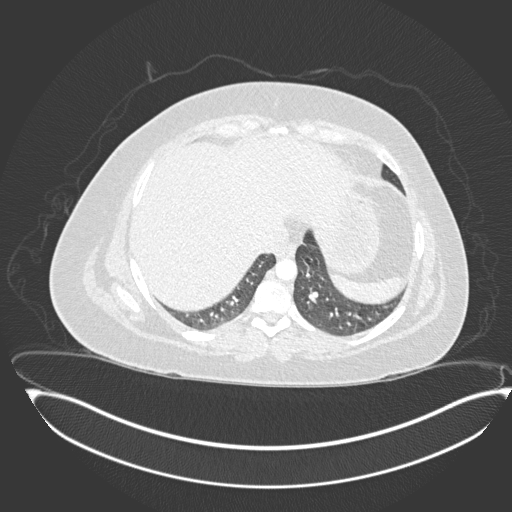
[im 112/287  soft-tissue]
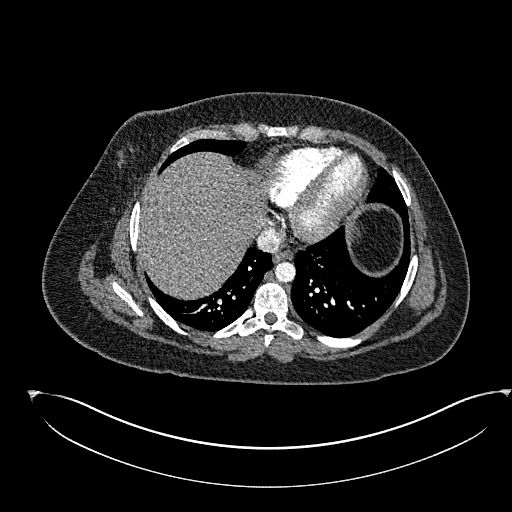
[im 137/287  lung]
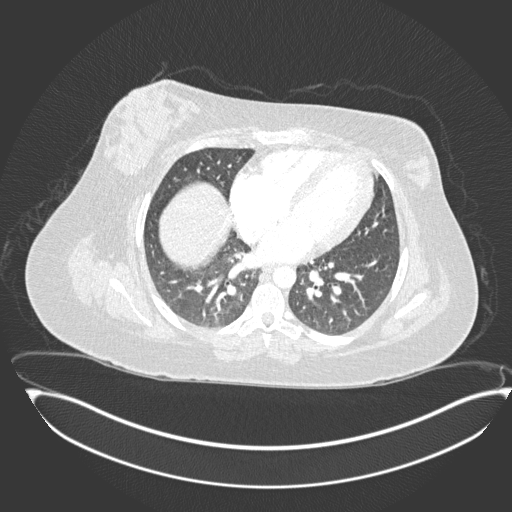
[im 150/287  soft-tissue]
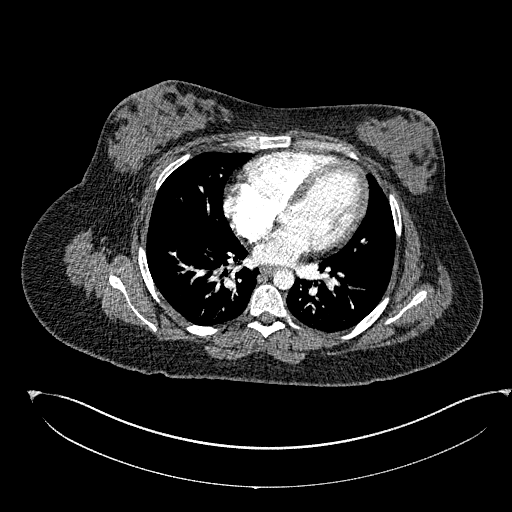
[im 175/287  lung]
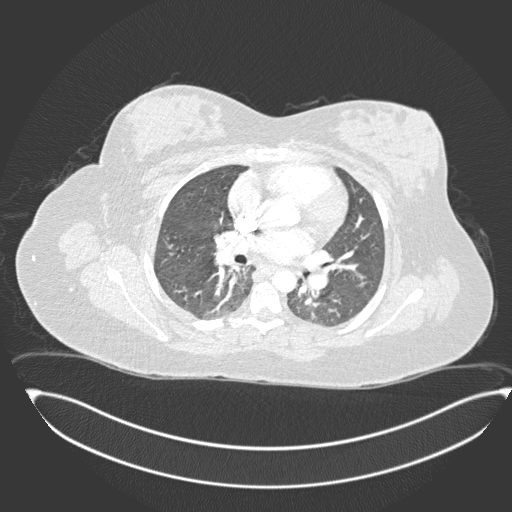
[im 187/287  soft-tissue]
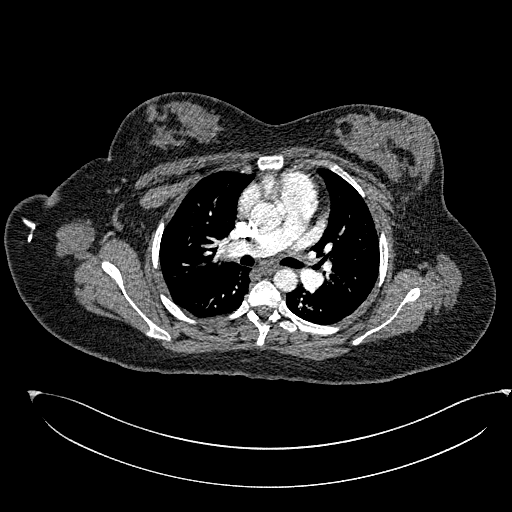
[im 212/287  lung]
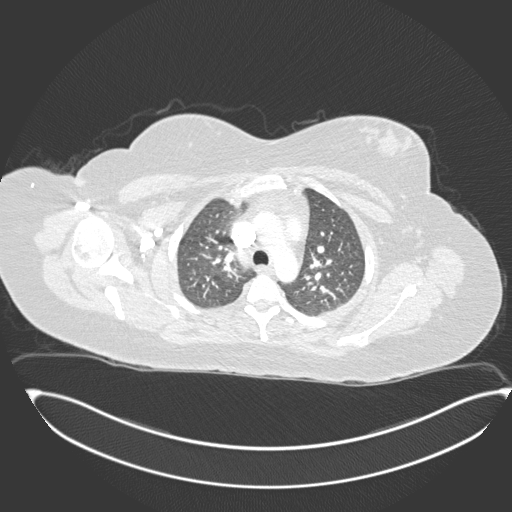
[im 237/287  soft-tissue]
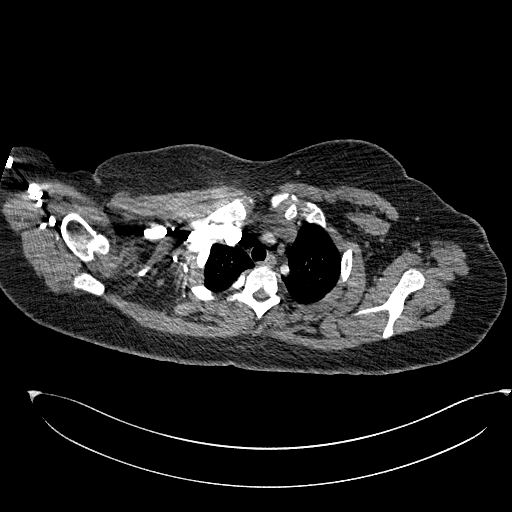
[im 249/287  lung]
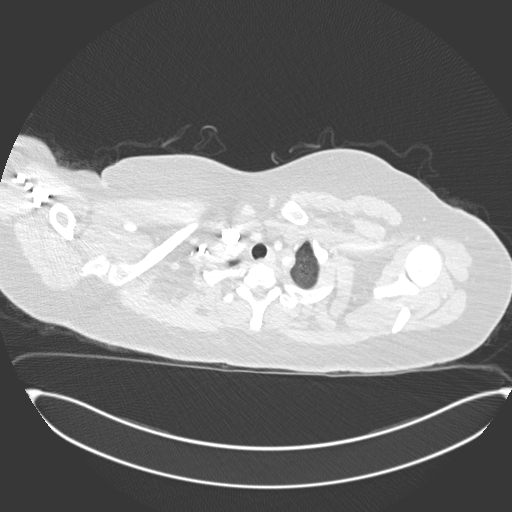
[im 274/287  soft-tissue]
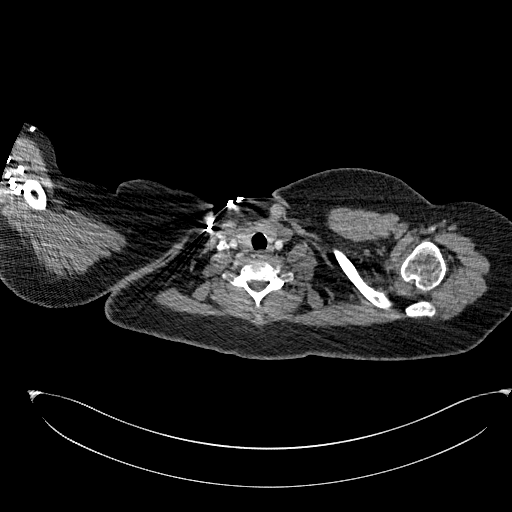

[Series 9: coronal mpr · coronal · 0.59mm/px · 3 of 151 slices shown]
[im 38/151  soft-tissue]
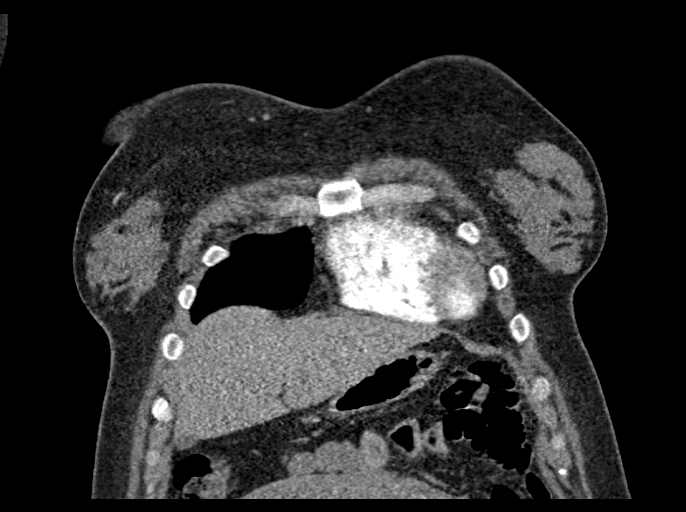
[im 76/151  soft-tissue]
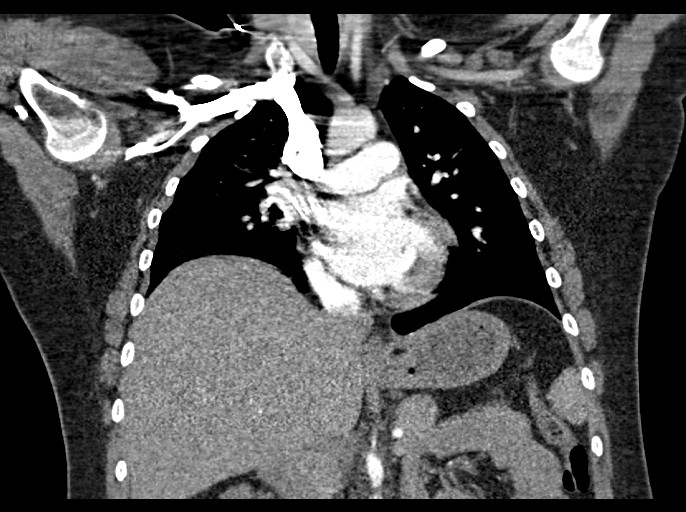
[im 113/151  soft-tissue]
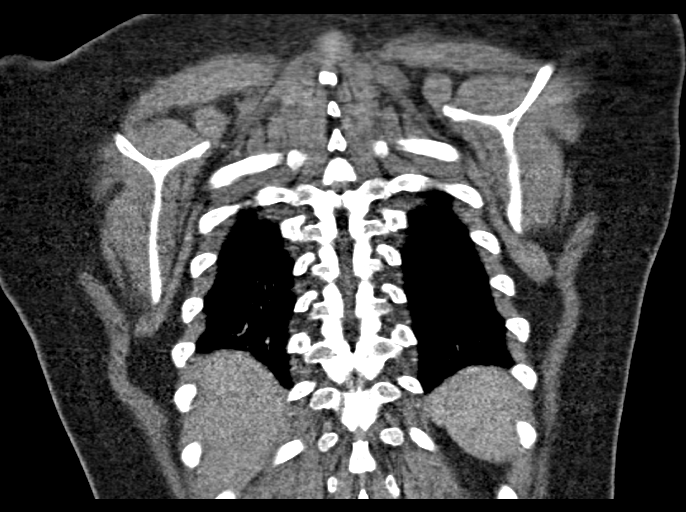

[17 of 46 positions shown; findings below may reference images not displayed]

FINDINGS: Cardiovascular: Fair opacification of the pulmonary arteries to the
segmental level. No evidence of pulmonary embolism. Unable to
evaluate at the subsegmental level due to timing of contrast. Normal
heart size. No significant pericardial effusion. The thoracic aorta
is normal in caliber. No atherosclerotic plaque of the thoracic
aorta. No coronary artery calcifications.

Mediastinum/Nodes: No enlarged mediastinal, hilar, or axillary lymph
nodes. Thyroid gland, trachea, and esophagus demonstrate no
significant findings.

Lungs/Pleura: No focal consolidation. No pulmonary nodule. No
pulmonary mass. No pleural effusion. No pneumothorax.

Upper Abdomen: Incompletely visualized 12 x 7.5 cm lesion within the
abdomen.

Musculoskeletal:

No chest wall abnormality.

No suspicious lytic or blastic osseous lesions. No acute displaced
fracture.

Review of the MIP images confirms the above findings.
IMPRESSION: 1. Incompletely visualized 12 x 7.5 cm lesion within the abdomen.
Recommend CT abdomen pelvis with intravenous contrast for further
evaluation.
2. No pulmonary embolus.
3. No acute intrathoracic abnormality.

ADDENDUM:
Please note new history of: Pregnant patient. This correlates with
the "partially visualized soft tissue density lesion noted in the
abdomen" that likely represents the uterine fundus. No further
evaluation with CT abdomen pelvis is indicated given this history.

These results were called by telephone at the time of interpretation
on 09/14/2021 at [DATE] to provider NAHTIGALD AUSFLUGE , who verbally
acknowledged these results.

*** End of Addendum ***
FINDINGS: Cardiovascular: Fair opacification of the pulmonary arteries to the
segmental level. No evidence of pulmonary embolism. Unable to
evaluate at the subsegmental level due to timing of contrast. Normal
heart size. No significant pericardial effusion. The thoracic aorta
is normal in caliber. No atherosclerotic plaque of the thoracic
aorta. No coronary artery calcifications.

Mediastinum/Nodes: No enlarged mediastinal, hilar, or axillary lymph
nodes. Thyroid gland, trachea, and esophagus demonstrate no
significant findings.

Lungs/Pleura: No focal consolidation. No pulmonary nodule. No
pulmonary mass. No pleural effusion. No pneumothorax.

Upper Abdomen: Incompletely visualized 12 x 7.5 cm lesion within the
abdomen.

Musculoskeletal:

No chest wall abnormality.

No suspicious lytic or blastic osseous lesions. No acute displaced
fracture.

Review of the MIP images confirms the above findings.
IMPRESSION: 1. Incompletely visualized 12 x 7.5 cm lesion within the abdomen.
Recommend CT abdomen pelvis with intravenous contrast for further
evaluation.
2. No pulmonary embolus.
3. No acute intrathoracic abnormality.

## 2024-06-20 DIAGNOSIS — Z0001 Encounter for general adult medical examination with abnormal findings: Secondary | ICD-10-CM | POA: Diagnosis not present
